# Patient Record
Sex: Female | Born: 1945 | Race: Black or African American | Hispanic: No | Marital: Single | Smoking: Never smoker
Health system: Southern US, Community
[De-identification: ages and names within clinical notes are randomized; demographics above are authoritative.]

## PROBLEM LIST (undated history)

## (undated) DIAGNOSIS — J189 Pneumonia, unspecified organism: Secondary | ICD-10-CM

## (undated) DIAGNOSIS — I10 Essential (primary) hypertension: Secondary | ICD-10-CM

## (undated) DIAGNOSIS — E7800 Pure hypercholesterolemia, unspecified: Secondary | ICD-10-CM

## (undated) DIAGNOSIS — K219 Gastro-esophageal reflux disease without esophagitis: Secondary | ICD-10-CM

## (undated) DIAGNOSIS — I499 Cardiac arrhythmia, unspecified: Secondary | ICD-10-CM

## (undated) DIAGNOSIS — M1990 Unspecified osteoarthritis, unspecified site: Secondary | ICD-10-CM

## (undated) DIAGNOSIS — D649 Anemia, unspecified: Secondary | ICD-10-CM

## (undated) HISTORY — PX: CYSTECTOMY: SHX5119

## (undated) HISTORY — PX: EYE SURGERY: SHX253

## (undated) HISTORY — PX: ARTHROPLASTY: SHX135

---

## 2011-03-29 ENCOUNTER — Emergency Department (HOSPITAL_COMMUNITY)
Admission: EM | Admit: 2011-03-29 | Discharge: 2011-03-29 | Disposition: A | Discharge status: Home / Self Care | Payer: Self-pay | Attending: Emergency Medicine | Admitting: Emergency Medicine

## 2011-03-29 ENCOUNTER — Encounter: Payer: Self-pay | Admitting: Nurse Practitioner

## 2011-03-29 ENCOUNTER — Other Ambulatory Visit: Payer: Self-pay

## 2011-03-29 ENCOUNTER — Emergency Department (HOSPITAL_COMMUNITY): Payer: Self-pay

## 2011-03-29 DIAGNOSIS — R079 Chest pain, unspecified: Secondary | ICD-10-CM | POA: Insufficient documentation

## 2011-03-29 DIAGNOSIS — E7800 Pure hypercholesterolemia, unspecified: Secondary | ICD-10-CM | POA: Insufficient documentation

## 2011-03-29 DIAGNOSIS — R110 Nausea: Secondary | ICD-10-CM | POA: Insufficient documentation

## 2011-03-29 DIAGNOSIS — K219 Gastro-esophageal reflux disease without esophagitis: Secondary | ICD-10-CM | POA: Insufficient documentation

## 2011-03-29 DIAGNOSIS — Z79899 Other long term (current) drug therapy: Secondary | ICD-10-CM | POA: Insufficient documentation

## 2011-03-29 DIAGNOSIS — R0602 Shortness of breath: Secondary | ICD-10-CM | POA: Insufficient documentation

## 2011-03-29 DIAGNOSIS — Z7982 Long term (current) use of aspirin: Secondary | ICD-10-CM | POA: Insufficient documentation

## 2011-03-29 DIAGNOSIS — I10 Essential (primary) hypertension: Secondary | ICD-10-CM | POA: Insufficient documentation

## 2011-03-29 DIAGNOSIS — M810 Age-related osteoporosis without current pathological fracture: Secondary | ICD-10-CM | POA: Insufficient documentation

## 2011-03-29 HISTORY — DX: Essential (primary) hypertension: I10

## 2011-03-29 HISTORY — DX: Pure hypercholesterolemia, unspecified: E78.00

## 2011-03-29 HISTORY — DX: Gastro-esophageal reflux disease without esophagitis: K21.9

## 2011-03-29 HISTORY — DX: Cardiac arrhythmia, unspecified: I49.9

## 2011-03-29 LAB — DIFFERENTIAL
Eosinophils Relative: 1 % (ref 0–5)
Lymphocytes Relative: 51 % — ABNORMAL HIGH (ref 12–46)
Monocytes Absolute: 0.3 10*3/uL (ref 0.1–1.0)
Monocytes Relative: 11 % (ref 3–12)
Neutro Abs: 1.1 10*3/uL — ABNORMAL LOW (ref 1.7–7.7)

## 2011-03-29 LAB — BASIC METABOLIC PANEL
BUN: 18 mg/dL (ref 6–23)
CO2: 30 mEq/L (ref 19–32)
Calcium: 9.7 mg/dL (ref 8.4–10.5)
Chloride: 105 mEq/L (ref 96–112)
Creatinine, Ser: 0.81 mg/dL (ref 0.50–1.10)
Glucose, Bld: 96 mg/dL (ref 70–99)

## 2011-03-29 LAB — CBC
HCT: 32.3 % — ABNORMAL LOW (ref 36.0–46.0)
Hemoglobin: 10.2 g/dL — ABNORMAL LOW (ref 12.0–15.0)
MCV: 96.1 fL (ref 78.0–100.0)
WBC: 3 10*3/uL — ABNORMAL LOW (ref 4.0–10.5)

## 2011-03-29 LAB — POCT I-STAT TROPONIN I

## 2011-03-29 MED ORDER — ALENDRONATE SODIUM 70 MG PO TABS: 70.0000 mg | ORAL_TABLET | ORAL | Status: DC

## 2011-03-29 MED ORDER — ASPIRIN 81 MG PO CHEW
324.0000 mg | CHEWABLE_TABLET | Freq: Once | ORAL | Status: AC
  Administered 2011-03-29: 324 mg via ORAL
  Filled 2011-03-29: qty 4

## 2011-03-29 MED ORDER — MORPHINE SULFATE 2 MG/ML IJ SOLN
2.0000 mg | Freq: Once | INTRAMUSCULAR | Status: AC
  Administered 2011-03-29: 2 mg via INTRAVENOUS
  Filled 2011-03-29: qty 1

## 2011-03-29 NOTE — ED Notes (Signed)
Lab at bedside

## 2011-03-29 NOTE — ED Notes (Signed)
Pt is calling her son to come and get her.

## 2011-03-29 NOTE — ED Notes (Signed)
Pt is ambulatory to the bathroom with one assist. 

## 2011-03-29 NOTE — ED Provider Notes (Signed)
Pt was a hand off patient from [facility], [place]. Pt here for chest pain for 3 years occuring more frequently.The patient was awaiting a second Tropinin and if negative a follow-up appointment needed to be scheduled with  Cardiology. I spoke with Dr. Nadara Eaton (private practice) who will call the patient in the morning to be seen within the next couple of days.   Dorthula Matas, PA 03/29/11 2022

## 2011-03-29 NOTE — ED Provider Notes (Signed)
History     CSN: [id number] Arrival date & time: 03/29/2011  1:28 PM   First MD Initiated Contact with Patient 03/29/11 1348      Chief Complaint  Patient presents with  . Chest Pain    (Consider location/radiation/quality/duration/timing/severity/associated sxs/prior treatment) HPI Comments: Patient who moved from [place] 2 months ago, with history of chest pain for 3 years - presents to the hospital today with episodes of chest pain that have been more frequent in nature. Patient states she had a episode of pressure in the middle of her chest that lasted approximately 20 minutes last night. This is associated with some shortness of breath and nausea. The pain did not radiate. She also admits to some short-lived episodes of pressure this morning. Patient has a remote history of a cardiac catheterization she states with minor blockages that did not require stenting. Patient has a history of high blood pressure and high cholesterol. She denies diabetes, smoking, significant family history.  Patient is a 65 y.o. female presenting with chest pain. The history is provided by the patient.  Chest Pain The chest pain began 3 - 5 hours ago. Chest pain occurs intermittently. The quality of the pain is described as pressure-like. The pain does not radiate. Primary symptoms include shortness of breath and nausea. Pertinent negatives for primary symptoms include no fever, no cough, no wheezing, no palpitations, no abdominal pain, no vomiting and no dizziness.  Pertinent negatives for associated symptoms include no diaphoresis. She tried nothing for the symptoms.  Her past medical history is significant for hyperlipidemia and hypertension.  Pertinent negatives for past medical history include no diabetes and no MI.  Pertinent negatives for family medical history include: no early MI in family.  Procedure history is positive for cardiac catheterization.     Past Medical History  Diagnosis Date  .  Acid reflux   . Hypertension   . Hypercholesteremia   . Osteoporosis   . Irregular heart beat     History reviewed. No pertinent past surgical history.  History reviewed. No pertinent family history.  History  Substance Use Topics  . Smoking status: Never Smoker   . Smokeless tobacco: Not on file  . Alcohol Use: No    OB History    Grav Para Term Preterm Abortions TAB SAB Ect Mult Living                  Review of Systems  Constitutional: Negative for fever and diaphoresis.  HENT: Negative for sore throat and rhinorrhea.   Eyes: Negative for redness.  Respiratory: Positive for shortness of breath. Negative for cough and wheezing.   Cardiovascular: Positive for chest pain. Negative for palpitations and leg swelling.  Gastrointestinal: Positive for nausea. Negative for vomiting and abdominal pain.  Genitourinary: Negative for dysuria.  Musculoskeletal: Negative for myalgias.  Skin: Negative for rash.  Neurological: Negative for dizziness and light-headedness.    Allergies  Penicillins  Home Medications   Current Outpatient Rx  Name Route Sig Dispense Refill  . ALENDRONATE SODIUM 70 MG PO TABS Oral Take 70 mg by mouth every 7 (seven) days. Take with a full glass of water on an empty stomach. Take on Thursday     . ASPIRIN EC 81 MG PO TBEC Oral Take 81 mg by mouth daily.      Marland Kitchen CALCIUM CARBONATE-VITAMIN D 500-200 MG-UNIT PO TABS Oral Take 1 tablet by mouth 2 (two) times daily.      Marland Kitchen LATANOPROST 0.005 %  OP SOLN Both Eyes Place 1 drop into both eyes at bedtime.      Marland Kitchen LOSARTAN POTASSIUM-HCTZ 100-25 MG PO TABS Oral Take 1 tablet by mouth daily.      Marland Kitchen METOPROLOL SUCCINATE ER 50 MG PO TB24 Oral Take 25 mg by mouth daily.      Marland Kitchen NIFEDIPINE ER 30 MG PO TB24 Oral Take 30 mg by mouth daily.      Marland Kitchen PANTOPRAZOLE SODIUM 40 MG PO TBEC Oral Take 40 mg by mouth daily.      Marland Kitchen SIMVASTATIN 40 MG PO TABS Oral Take 40 mg by mouth at bedtime.        BP 132/68  Pulse 56  Temp(Src)  98.6 F (37 C) (Oral)  Resp 16  Ht 5\' 4"  (1.626 m)  Wt 135 lb (61.236 kg)  BMI 23.17 kg/m2  SpO2 100%  Physical Exam  Nursing note and vitals reviewed. Constitutional: She is oriented to person, place, and time. She appears well-developed and well-nourished.  HENT:  Head: Normocephalic and atraumatic.  Eyes: Pupils are equal, round, and reactive to light. Right eye exhibits no discharge. Left eye exhibits no discharge.  Neck: Normal range of motion. Neck supple.  Cardiovascular: Normal rate and regular rhythm.  Exam reveals no gallop and no friction rub.   No murmur heard. Pulmonary/Chest: Effort normal and breath sounds normal. No respiratory distress. She has no wheezes. She exhibits no tenderness.  Abdominal: Soft. There is no tenderness. There is no rebound and no guarding.  Musculoskeletal: Normal range of motion.  Neurological: She is alert and oriented to person, place, and time.  Skin: Skin is warm and dry. No rash noted.  Psychiatric: She has a normal mood and affect.    ED Course  Procedures (including critical care time)   Labs Reviewed  CBC  DIFFERENTIAL  BASIC METABOLIC PANEL  I-STAT TROPONIN I   No results found.   1. Chest pain     1:57 PM Patient seen and examined. Workup for chest pain initiated. EKG reviewed.   Date: 03/29/2011  Rate: 51  Rhythm: sinus bradycardia  QRS Axis: normal  Intervals: normal  ST/T Wave abnormalities: normal  Conduction Disutrbances:none  Narrative Interpretation:   Old EKG Reviewed: none available  3:32 PM x-ray reviewed by myself. Handoff to [employer] Plan: complete work-up including 2 cardiac markers and call cards to arrange follow-up.   MDM  Chest pain x 3 years, increased frequency recently.         Eustace Moore [place], [place] 03/29/11 1535

## 2011-03-29 NOTE — ED Provider Notes (Signed)
Medical screening examination/treatment/procedure(s) were conducted as a shared visit with non-physician practitioner(s) and myself.  I personally evaluated the patient during the encounter.  Pt alert and comfortable. She has ongoing CP, intermittantly for years. Evaluation in ED is negative. Pt has no chest pain now.   Flint Melter, MD 03/29/11 2022

## 2011-03-29 NOTE — ED Provider Notes (Signed)
Medical screening examination/treatment/procedure(s) were conducted as a shared visit with non-physician practitioner(s) and myself.  I personally evaluated the patient during the encounter  Flint Melter, MD 03/29/11 [phone]

## 2011-03-29 NOTE — ED Notes (Signed)
C/o intermittent cp, sob, nausea since last night. Pt states she is currently being treated by a cardiologist for chest pain and they have not been able to make a diagnosis yet. Pt states pain in LUQ/L breast

## 2011-03-29 NOTE — ED Provider Notes (Signed)
Medical screening examination/treatment/procedure(s) were performed by non-physician practitioner and as supervising physician I was immediately available for consultation/collaboration.  Heather Mckendree R. Lucciana Head, MD 03/29/11 1551 

## 2011-04-18 ENCOUNTER — Encounter: Payer: Self-pay | Admitting: Family Medicine

## 2011-04-18 ENCOUNTER — Ambulatory Visit (INDEPENDENT_AMBULATORY_CARE_PROVIDER_SITE_OTHER): Payer: Self-pay | Admitting: Family Medicine

## 2011-04-18 DIAGNOSIS — I493 Ventricular premature depolarization: Secondary | ICD-10-CM | POA: Insufficient documentation

## 2011-04-18 DIAGNOSIS — K219 Gastro-esophageal reflux disease without esophagitis: Secondary | ICD-10-CM | POA: Insufficient documentation

## 2011-04-18 DIAGNOSIS — E785 Hyperlipidemia, unspecified: Secondary | ICD-10-CM | POA: Insufficient documentation

## 2011-04-18 DIAGNOSIS — I4949 Other premature depolarization: Secondary | ICD-10-CM

## 2011-04-18 DIAGNOSIS — M810 Age-related osteoporosis without current pathological fracture: Secondary | ICD-10-CM | POA: Insufficient documentation

## 2011-04-18 DIAGNOSIS — I10 Essential (primary) hypertension: Secondary | ICD-10-CM

## 2011-04-18 LAB — COMPREHENSIVE METABOLIC PANEL
ALT: 10 U/L (ref 0–35)
Albumin: 4.1 g/dL (ref 3.5–5.2)
CO2: 28 mEq/L (ref 19–32)
Calcium: 9.4 mg/dL (ref 8.4–10.5)
Chloride: 105 mEq/L (ref 96–112)
Potassium: 3.5 mEq/L (ref 3.5–5.3)
Sodium: 143 mEq/L (ref 135–145)
Total Bilirubin: 0.3 mg/dL (ref 0.3–1.2)
Total Protein: 6.6 g/dL (ref 6.0–8.3)

## 2011-04-18 LAB — CBC
MCV: 98.3 fL (ref 78.0–100.0)
Platelets: 214 10*3/uL (ref 150–400)
RDW: 13.6 % (ref 11.5–15.5)
WBC: 3 10*3/uL — ABNORMAL LOW (ref 4.0–10.5)

## 2011-04-18 LAB — LDL CHOLESTEROL, DIRECT: Direct LDL: 69 mg/dL

## 2011-04-18 MED ORDER — ALENDRONATE SODIUM 70 MG PO TABS: 70.0000 mg | ORAL_TABLET | ORAL | Status: DC

## 2011-04-18 MED ORDER — METOPROLOL SUCCINATE ER 50 MG PO TB24: 25.0000 mg | ORAL_TABLET | Freq: Every day | ORAL | Status: DC

## 2011-04-18 NOTE — Progress Notes (Signed)
  Subjective:    Patient ID: Kristy Cox, female    DOB: 08-Jun-1945, 66 y.o.   MRN: [id number]  HPI 1. Hypertension/establish care: Patient moved to the area approximately 3 months ago from [place]. Would like to be established with a physician here. Has history of hypertension currently on nifedipine, losartan/HCTZ, metoprolol. Blood pressure is well controlled on this regimen. She does check her blood pressure at home and typically runs in the 115-120 systolic. She does get occasional palpitations however she denies chest pain, headache, nausea, weakness, vision changes. She needs a refill on her metoprolol.  2. Osteoporosis: She had a bone density test last year however she is unsure of her T score. Currently on alendronate over the past year, along with calcium and vitamin D. No history of fracture.     Review of Systems Per history of present illness    Objective:   Physical Exam Generally: Very pleasant female in no acute distress Neck: Supple without adenopathy or thyromegaly. Cardio: Regular rate and rhythm, no murmur, no rub, no gallop. No extra beats heard. Pulmonary: Lungs are clear to auscultation bilaterally without wheezes Extremities: Warm well perfused without edema.       Assessment & Plan:

## 2011-04-18 NOTE — Patient Instructions (Signed)
It was nice to meet you today. It appears that your blood pressure is under good control. Please look into getting the orange card to assist you in your visits within the [facility] system I will let you know the results of your labs when they return If you have questions please let me know.  I would like to see you back in 6 months or sooner as needed.

## 2011-04-18 NOTE — Assessment & Plan Note (Addendum)
BP appears well controlled.  Continue current medications, toprol xl refilled.  Will check CM[facility] today for baseline since on Losartan.

## 2011-04-18 NOTE — Assessment & Plan Note (Addendum)
Currently on simvastatin, continue and check D-LDL today.  Check LFT's from CM[facility]

## 2011-09-09 ENCOUNTER — Observation Stay (HOSPITAL_COMMUNITY)
Admission: EM | Admit: 2011-09-09 | Discharge: 2011-09-11 | Disposition: A | Discharge status: Home / Self Care | Payer: Medicare Other | Attending: Family Medicine | Admitting: Family Medicine

## 2011-09-09 ENCOUNTER — Encounter (HOSPITAL_COMMUNITY): Payer: Self-pay | Admitting: Emergency Medicine

## 2011-09-09 DIAGNOSIS — I10 Essential (primary) hypertension: Secondary | ICD-10-CM | POA: Insufficient documentation

## 2011-09-09 DIAGNOSIS — M810 Age-related osteoporosis without current pathological fracture: Secondary | ICD-10-CM | POA: Insufficient documentation

## 2011-09-09 DIAGNOSIS — I951 Orthostatic hypotension: Secondary | ICD-10-CM | POA: Insufficient documentation

## 2011-09-09 DIAGNOSIS — R55 Syncope and collapse: Secondary | ICD-10-CM

## 2011-09-09 DIAGNOSIS — E876 Hypokalemia: Secondary | ICD-10-CM | POA: Insufficient documentation

## 2011-09-09 DIAGNOSIS — E785 Hyperlipidemia, unspecified: Secondary | ICD-10-CM | POA: Diagnosis present

## 2011-09-09 DIAGNOSIS — I4949 Other premature depolarization: Secondary | ICD-10-CM | POA: Insufficient documentation

## 2011-09-09 DIAGNOSIS — R42 Dizziness and giddiness: Principal | ICD-10-CM | POA: Insufficient documentation

## 2011-09-09 DIAGNOSIS — E7800 Pure hypercholesterolemia, unspecified: Secondary | ICD-10-CM | POA: Insufficient documentation

## 2011-09-09 LAB — CBC
Hemoglobin: 11.8 g/dL — ABNORMAL LOW (ref 12.0–15.0)
MCHC: 32.9 g/dL (ref 30.0–36.0)
RDW: 13.4 % (ref 11.5–15.5)

## 2011-09-09 LAB — BASIC METABOLIC PANEL
BUN: 12 mg/dL (ref 6–23)
GFR calc Af Amer: 83 mL/min — ABNORMAL LOW (ref >90)
GFR calc non Af Amer: 71 mL/min — ABNORMAL LOW (ref >90)
Potassium: 3.2 mEq/L — ABNORMAL LOW (ref 3.5–5.1)

## 2011-09-09 LAB — POCT I-STAT TROPONIN I: Troponin i, poc: 0 ng/mL (ref 0.00–0.08)

## 2011-09-09 MED ORDER — SODIUM CHLORIDE 0.9 % IV BOLUS (SEPSIS)
750.0000 mL | Freq: Once | INTRAVENOUS | Status: AC
  Administered 2011-09-09: 750 mL via INTRAVENOUS

## 2011-09-09 MED ORDER — POTASSIUM CHLORIDE CRYS ER 20 MEQ PO TBCR
40.0000 meq | EXTENDED_RELEASE_TABLET | Freq: Once | ORAL | Status: AC
  Administered 2011-09-09: 40 meq via ORAL
  Filled 2011-09-09: qty 2

## 2011-09-09 MED ORDER — ONDANSETRON 4 MG PO TBDP
4.0000 mg | ORAL_TABLET | Freq: Once | ORAL | Status: AC
  Administered 2011-09-09: 4 mg via ORAL
  Filled 2011-09-09: qty 1

## 2011-09-09 NOTE — ED Notes (Signed)
Updated on being admitted to the hosp and family  At bedside.

## 2011-09-09 NOTE — H&P (Signed)
Kristy Cox is an 66 y.o. female.   Chief Complaint: Dizziness HPI: [name] with PMHx significant for HTN, HLD and PVCs presents with dizziness.  Dizziness first started ~ 2 weeks ago while at the store.  Symptoms feels as feel the room is spinning, not like she is getting ready to pass out.  This can happen when she is sitting but is more common when standing.  It does not happen when she is lying down.  It may happen during positional changes, such as sitting to standing.  Has been having 3-4 episodes per day, which have not changed over the past 2 weeks.  Orthostatic vital signs in the ED were positive.  Has associated nausea with these episodes and some headache/pounding in her head. Denies chest pain, SOB, weakness, or confusion with these episodes.  Denies palpitations.  No vision or hearing changes.  Feels like it is similar to vertigo, which she has had in the past.  Has not had any recent viral illnesses.  Does complain of some runny nose, which she attributes to allergies.   While in the ED, pt has positive orthostatic vital signs and became very white at pale upon standing at one point.  She also had750cc NS, of K-dur and Zofran 4mg  x1. Does not appear dehydrated. Hgb 11.8 (10.9 5 months ago), EKG showing sinus bradycardia (HR 51).  Past Medical History  Diagnosis Date  . Acid reflux   . Hypertension   . Hypercholesteremia   . Osteoporosis   . Irregular heart beat     PVC's    History reviewed. No pertinent past surgical history.  Family History  Problem Relation Age of Onset  . Breast cancer Mother   . Lung cancer Sister   . Coronary artery disease     Social History:  reports that she has never smoked. She does not have any smokeless tobacco history on file. She reports that she does not drink alcohol or use illicit drugs.  Allergies:  Allergies  Allergen Reactions  . Penicillins Other (See Comments)    unknown     (Not in a hospital admission)  Results for  orders placed during the hospital encounter of 09/09/11 (from the past 48 hour(s))  BASIC METABOLIC PANEL     Status: Abnormal   Collection Time   09/09/11  6:03 PM      Component Value Range Comment   Sodium 142  135 - 145 (mEq/L)    Potassium 3.2 (*) 3.5 - 5.1 (mEq/L)    Chloride 102  96 - 112 (mEq/L)    CO2 28  19 - 32 (mEq/L)    Glucose, Bld 95  70 - 99 (mg/dL)    BUN 12  6 - 23 (mg/dL)    Creatinine, Ser 4.09  0.50 - 1.10 (mg/dL)    Calcium 81.1 (*) 8.4 - 10.5 (mg/dL)    GFR calc non Af Amer 71 (*) >90 (mL/min)    GFR calc Af Amer 83 (*) >90 (mL/min)   CBC     Status: Abnormal   Collection Time   09/09/11  6:03 PM      Component Value Range Comment   WBC 3.6 (*) 4.0 - 10.5 (K/uL)    RBC 3.90  3.87 - 5.11 (MIL/uL)    Hemoglobin 11.8 (*) 12.0 - 15.0 (g/dL)    HCT 91.4 (*) 78.2 - 46.0 (%)    MCV 92.1  78.0 - 100.0 (fL)    MCH 30.3  26.0 - 34.0 (pg)    MCHC 32.9  30.0 - 36.0 (g/dL)    RDW 09.8  11.9 - 14.7 (%)    Platelets 187  150 - 400 (K/uL)    No results found.  Review of Systems  Constitutional: Negative for fever, chills and malaise/fatigue.  HENT: Positive for congestion. Negative for hearing loss, ear pain, sore throat, tinnitus and ear discharge.   Eyes: Negative for blurred vision and double vision.  Respiratory: Negative for cough and shortness of breath.   Cardiovascular: Negative for chest pain, palpitations, claudication and leg swelling.  Gastrointestinal: Positive for nausea. Negative for heartburn, vomiting, abdominal pain, diarrhea, constipation, blood in stool and melena.  Genitourinary: Negative for dysuria.  Musculoskeletal: Negative for myalgias and falls.  Skin: Negative for rash.  Neurological: Positive for dizziness and headaches. Negative for tremors, sensory change, speech change, focal weakness, seizures, loss of consciousness and weakness.    Blood pressure 152/87, pulse 62, temperature 98.4 F (36.9 C), temperature source Oral, resp. rate 18,  SpO2 98.00%. Physical Exam  Constitutional: She is oriented to person, place, and time. She appears well-developed and well-nourished. No distress.  HENT:  Head: Normocephalic and atraumatic.  Right Ear: External ear normal.  Left Ear: External ear normal.  Nose: Nose normal.  Mouth/Throat: Oropharynx is clear and moist. No oropharyngeal exudate.  Eyes: Conjunctivae are normal. Pupils are equal, round, and reactive to light.  Neck: Normal range of motion. Neck supple. No thyromegaly present.  Cardiovascular: Normal rate, regular rhythm, normal heart sounds and intact distal pulses.   No murmur heard. Respiratory: Effort normal and breath sounds normal. No respiratory distress. She has no wheezes.  GI: Soft. Bowel sounds are normal. She exhibits no distension. There is no tenderness.  Musculoskeletal: Normal range of motion. She exhibits no edema.  Lymphadenopathy:    She has no cervical adenopathy.  Neurological: She is oriented to person, place, and time. No cranial nerve deficit.  Skin: Skin is warm and dry. No rash noted.     Assessment/Plan  [name] with PMHx significant for HTN, HLD and PVCs presents with dizziness  1. Dizziness: vertigo/labrinthytis vs vasovagal vs orthostatic hypotension vs other -Positive orthostatic vital signs in the ED without apparent reason for dehydration/volume depletion (no recent illnesses or decreased po)- lying: 131/73, 56 --> sitting: 133/78, 62 --> standing 111/80, 66 -Admit to tele to monitor for dysrhythmias  -Check TSH, AM EKG -Optimize electrolytes, check Mag and Phos in AM with BMET -Consider meclizine in AM  2. Hypokalemia: 3.2 -Received po K-dur in ED -Repeat BMET in AM  3. HTN, h/o PVCs: lying BPs slightly elevated, but standing BPs normal; no PVCs on EKG -Home regimen: hyzaar 100/25, procardia 30; was on metoprolol but has not taken in >1 month  4. HLD: -home regimen zocor 40 -continue while in house  FEN/GI: heart  healthy diet ad lib, NS @ 100cc/hr Ppx: SQ heparin, protonix (home med) Dispo: obs in tele overnight to r/o dysrhythmia as cause of dizziness   Amandalee Lacap 09/09/2011, 9:13 PM

## 2011-09-09 NOTE — ED Notes (Signed)
Attempted to call report, unit sec stated pt nurse is on lunch

## 2011-09-09 NOTE — ED Provider Notes (Signed)
  I performed a history and physical examination of Kristy Cox and discussed her management with Dr. Ashley Royalty.  I agree with the history, physical, assessment, and plan of care, with the following exceptions: None  This elderly F p/w dizziness, and in spite of fluids, she remained acutely symptomatic.  She had essentially unremarkable ED eval and was admitted for further e/m.  Elyse Jarvis, MD 09/09/11 2226

## 2011-09-09 NOTE — ED Notes (Signed)
Pt complains of nausea for several weeks now and dizziness.

## 2011-09-09 NOTE — ED Notes (Addendum)
Pt almost pass out getting OOB to chair, pt became very pale and diaphoretic, lips white and pt stated I am blacking out. Assisted pt back to bed. MD notified of  The above incident.

## 2011-09-09 NOTE — ED Notes (Signed)
Pt states dizziness in the setting to standing position

## 2011-09-09 NOTE — ED Provider Notes (Signed)
History     CSN: [id number]  Arrival date & time 09/09/11  1553   First MD Initiated Contact with Patient 09/09/11 1742      Chief Complaint  Patient presents with  . Dizziness    (Consider location/radiation/quality/duration/timing/severity/associated sxs/prior treatment) HPI 66 yo female with history of HTN and PVC's who presents with complaint of dizziness.  States that dizziness began ~2 weeks ago when she was at the grocery store.  Describes symptoms as "feeling like the room is spinning".   States "episodes" can come on while sitting down but worse with positional movements, such as standing or lying down.  She does have associated nausea.  She denies any feeling that she is going to pass out.  Hx of PVC's but denies palpitations.  No recent illness or seasonal allergies.  Has had vertigo in the past and this feels similar to that episode.  No chest pain, weakness, shortness of breath. Past Medical History  Diagnosis Date  . Acid reflux   . Hypertension   . Hypercholesteremia   . Osteoporosis   . Irregular heart beat     PVC's    History reviewed. No pertinent past surgical history.  Family History  Problem Relation Age of Onset  . Breast cancer Mother   . Lung cancer Sister   . Coronary artery disease      History  Substance Use Topics  . Smoking status: Never Smoker   . Smokeless tobacco: Not on file  . Alcohol Use: No    OB History    Grav Para Term Preterm Abortions TAB SAB Ect Mult Living                  Review of Systems  Allergies  Penicillins  Home Medications   Current Outpatient Rx  Name Route Sig Dispense Refill  . ALENDRONATE SODIUM 70 MG PO TABS Oral Take 70 mg by mouth every 7 (seven) days. Take with a full glass of water on an empty stomach. Take on Thursday    . ASPIRIN EC 81 MG PO TBEC Oral Take 81 mg by mouth daily.      Marland Kitchen CALCIUM CARBONATE-VITAMIN D 500-200 MG-UNIT PO TABS Oral Take 1 tablet by mouth 2 (two) times daily.      Marland Kitchen  LATANOPROST 0.005 % OP SOLN Both Eyes Place 1 drop into both eyes at bedtime.      Marland Kitchen LOSARTAN POTASSIUM-HCTZ 100-25 MG PO TABS Oral Take 1 tablet by mouth daily.      Marland Kitchen NIFEDIPINE ER 30 MG PO TB24 Oral Take 30 mg by mouth daily.      Marland Kitchen PANTOPRAZOLE SODIUM 40 MG PO TBEC Oral Take 40 mg by mouth daily.      Marland Kitchen SIMVASTATIN 40 MG PO TABS Oral Take 40 mg by mouth at bedtime.        BP 147/82  Pulse 57  Temp(Src) 97.7 F (36.5 C) (Oral)  Resp 20  SpO2 99%  Physical Exam  Constitutional: She appears well-nourished. No distress.  HENT:  Head: Normocephalic and atraumatic.  Right Ear: Tympanic membrane, external ear and ear canal normal.  Left Ear: Tympanic membrane, external ear and ear canal normal.  Nose: No mucosal edema or rhinorrhea. Right sinus exhibits no maxillary sinus tenderness and no frontal sinus tenderness. Left sinus exhibits no maxillary sinus tenderness and no frontal sinus tenderness.  Mouth/Throat: Oropharynx is clear and moist.    ED Course  Procedures (including critical care time)  Labs Reviewed  BASIC METABOLIC PANEL - Abnormal; Notable for the following:    Potassium 3.2 (*)    Calcium 10.7 (*)    GFR calc non Af Amer 71 (*)    GFR calc Af Amer 83 (*)    All other components within normal limits  CBC - Abnormal; Notable for the following:    WBC 3.6 (*)    Hemoglobin 11.8 (*)    HCT 35.9 (*)    All other components within normal limits   No results found.   No diagnosis found.    MDM  Symptoms sound more like vertiginous symptoms, however Orthostatics with >60mmHg , will give fluid bolus and see if sx improves.  No neurological deficits or nystagmus on exam.    8:13 PM   After fluid bolus, no improvement of symptoms.  Still with severe dizziness and turned pale with light headedness with standing after fluid bolus.  FP called for admission. 9:13 PM           Everrett Coombe, DO 09/09/11 2114

## 2011-09-09 NOTE — ED Notes (Signed)
Pt and family updated about the room #

## 2011-09-09 NOTE — ED Notes (Signed)
C/o intermittent dizziness and nausea x 2 weeks.  Denies pain.

## 2011-09-10 ENCOUNTER — Encounter (HOSPITAL_COMMUNITY): Payer: Self-pay | Admitting: *Deleted

## 2011-09-10 ENCOUNTER — Observation Stay (HOSPITAL_COMMUNITY): Payer: Medicare Other

## 2011-09-10 DIAGNOSIS — R42 Dizziness and giddiness: Secondary | ICD-10-CM

## 2011-09-10 LAB — CREATININE, SERUM
Creatinine, Ser: 0.84 mg/dL (ref 0.50–1.10)
GFR calc non Af Amer: 71 mL/min — ABNORMAL LOW (ref >90)

## 2011-09-10 LAB — CBC
HCT: 34.4 % — ABNORMAL LOW (ref 36.0–46.0)
Hemoglobin: 11.3 g/dL — ABNORMAL LOW (ref 12.0–15.0)
MCH: 30.3 pg (ref 26.0–34.0)
MCHC: 32.8 g/dL (ref 30.0–36.0)
RBC: 3.73 MIL/uL — ABNORMAL LOW (ref 3.87–5.11)

## 2011-09-10 LAB — BASIC METABOLIC PANEL
Chloride: 106 mEq/L (ref 96–112)
GFR calc Af Amer: 82 mL/min — ABNORMAL LOW (ref >90)
GFR calc non Af Amer: 70 mL/min — ABNORMAL LOW (ref >90)
Glucose, Bld: 86 mg/dL (ref 70–99)
Potassium: 3.5 mEq/L (ref 3.5–5.1)
Sodium: 142 mEq/L (ref 135–145)

## 2011-09-10 LAB — TSH: TSH: 1.541 u[IU]/mL (ref 0.350–4.500)

## 2011-09-10 LAB — PHOSPHORUS: Phosphorus: 3.5 mg/dL (ref 2.3–4.6)

## 2011-09-10 MED ORDER — LOSARTAN POTASSIUM 50 MG PO TABS
50.0000 mg | ORAL_TABLET | Freq: Every day | ORAL | Status: DC
  Filled 2011-09-10: qty 1

## 2011-09-10 MED ORDER — LATANOPROST 0.005 % OP SOLN
1.0000 [drp] | Freq: Every day | OPHTHALMIC | Status: DC
  Administered 2011-09-10: 1 [drp] via OPHTHALMIC
  Filled 2011-09-10: qty 2.5

## 2011-09-10 MED ORDER — SIMVASTATIN 40 MG PO TABS
40.0000 mg | ORAL_TABLET | Freq: Every day | ORAL | Status: DC
  Administered 2011-09-10: 40 mg via ORAL
  Filled 2011-09-10 (×2): qty 1

## 2011-09-10 MED ORDER — NIFEDIPINE ER 30 MG PO TB24
30.0000 mg | ORAL_TABLET | Freq: Every day | ORAL | Status: DC
  Filled 2011-09-10: qty 1

## 2011-09-10 MED ORDER — SODIUM CHLORIDE 0.9 % IV SOLN
INTRAVENOUS | Status: DC
  Administered 2011-09-10: 01:00:00 via INTRAVENOUS

## 2011-09-10 MED ORDER — MECLIZINE HCL 12.5 MG PO TABS
12.5000 mg | ORAL_TABLET | Freq: Two times a day (BID) | ORAL | Status: DC
  Administered 2011-09-10 – 2011-09-11 (×2): 12.5 mg via ORAL
  Filled 2011-09-10 (×4): qty 1

## 2011-09-10 MED ORDER — SODIUM CHLORIDE 0.9 % IJ SOLN
3.0000 mL | Freq: Two times a day (BID) | INTRAMUSCULAR | Status: DC
  Administered 2011-09-10 (×3): 3 mL via INTRAVENOUS

## 2011-09-10 MED ORDER — ASPIRIN EC 81 MG PO TBEC
81.0000 mg | DELAYED_RELEASE_TABLET | Freq: Every day | ORAL | Status: DC
  Administered 2011-09-10 – 2011-09-11 (×2): 81 mg via ORAL
  Filled 2011-09-10 (×2): qty 1

## 2011-09-10 MED ORDER — PANTOPRAZOLE SODIUM 40 MG PO TBEC
40.0000 mg | DELAYED_RELEASE_TABLET | Freq: Every day | ORAL | Status: DC
  Administered 2011-09-10 – 2011-09-11 (×2): 40 mg via ORAL
  Filled 2011-09-10 (×2): qty 1

## 2011-09-10 MED ORDER — MECLIZINE HCL 12.5 MG PO TABS
12.5000 mg | ORAL_TABLET | Freq: Two times a day (BID) | ORAL | Status: DC
  Filled 2011-09-10: qty 1

## 2011-09-10 MED ORDER — LOSARTAN POTASSIUM-HCTZ 100-25 MG PO TABS
1.0000 | ORAL_TABLET | Freq: Every day | ORAL | Status: DC
Start: 2011-09-10 — End: 2011-09-10

## 2011-09-10 MED ORDER — LOSARTAN POTASSIUM 50 MG PO TABS
100.0000 mg | ORAL_TABLET | Freq: Every day | ORAL | Status: DC
  Administered 2011-09-10: 100 mg via ORAL
  Filled 2011-09-10: qty 2

## 2011-09-10 MED ORDER — ACETAMINOPHEN 325 MG PO TABS: 650.0000 mg | ORAL_TABLET | Freq: Four times a day (QID) | ORAL | Status: DC | PRN

## 2011-09-10 MED ORDER — HYDROCHLOROTHIAZIDE 25 MG PO TABS
25.0000 mg | ORAL_TABLET | Freq: Every day | ORAL | Status: DC
  Administered 2011-09-10: 25 mg via ORAL
  Filled 2011-09-10: qty 1

## 2011-09-10 MED ORDER — HEPARIN SODIUM (PORCINE) 5000 UNIT/ML IJ SOLN
5000.0000 [IU] | Freq: Three times a day (TID) | INTRAMUSCULAR | Status: DC
  Administered 2011-09-10 – 2011-09-11 (×4): 5000 [IU] via SUBCUTANEOUS
  Filled 2011-09-10 (×7): qty 1

## 2011-09-10 MED ORDER — ACETAMINOPHEN 650 MG RE SUPP: 650.0000 mg | Freq: Four times a day (QID) | RECTAL | Status: DC | PRN

## 2011-09-10 MED ORDER — HYDROCHLOROTHIAZIDE 25 MG PO TABS
25.0000 mg | ORAL_TABLET | Freq: Every day | ORAL | Status: DC
  Filled 2011-09-10: qty 1

## 2011-09-10 NOTE — H&P (Signed)
Family Medicine Teaching Service Attending Note  I interviewed and examined patient Kristy Cox and reviewed their tests and x-rays.  I discussed with Dr. Fara Boros and reviewed their note for today.  I agree with their assessment and plan.     Additionally  This AM feels well until stands then lightheadness. No vertigo. Needs to hold on to walk. Neuro exam is normal except for gait Most likely cause of symptoms is a combination of vestibular dysfunction causing her episodic vertigo complaints and othostatic hypotension causing her ambulation difficulties.  However need to rule out cerebellar CVA given her age and risk factors - Check MRI.  Reduce blood pressure medications

## 2011-09-10 NOTE — Progress Notes (Signed)
Reviewed MRI results with patient and son.  Patient tolerated one dose of Meclizine.  BP remained low 90/60 after taking medication, but patient denied any dizziness.  Nifedipine held.  PT/OT recommended outpatient follow up.  Will D/C home tomorrow in AM after getting Meclizine in AM.  Will set up outpatient PT/OT and follow up with PCP prior to discharge.  Patient and son agree with plan.

## 2011-09-10 NOTE — Progress Notes (Signed)
Family Medicine Teaching Service Attending Note  I interviewed and examined patient Kristy Cox and reviewed their tests and x-rays.  I discussed with Dr. Fara Boros and reviewed their note for today.  I agree with their assessment and plan.     Additionally  See admit note

## 2011-09-10 NOTE — Discharge Summary (Signed)
Physician Discharge Summary  Patient ID: Kristy Cox [id number] 07-21-1945 66 y.o.  Admit date: 09/09/2011 Discharge date: 09/11/2011  PCP: Dr. Ashley Royalty   Discharge Diagnosis: 1. Dizziness/Vertigo 2. Orthostatic hypotension   Discharge Medications  [place], [place]  Home Medication Instructions FAO:[id number]   Printed on:09/11/11 0900  Medication Information                    calcium-vitamin D (OSCAL WITH D) 500-200 MG-UNIT per tablet Take 1 tablet by mouth 2 (two) times daily.             pantoprazole (PROTONIX) 40 MG tablet Take 40 mg by mouth daily.             simvastatin (ZOCOR) 40 MG tablet Take 40 mg by mouth at bedtime.             latanoprost (XALATAN) 0.005 % ophthalmic solution Place 1 drop into both eyes at bedtime.             aspirin EC 81 MG tablet Take 81 mg by mouth daily.             alendronate (FOSAMAX) 70 MG tablet Take 70 mg by mouth every 7 (seven) days. Take with a full glass of water on an empty stomach. Take on Thursday           meclizine (ANTIVERT) 12.5 MG tablet Take 1 tablet (12.5 mg total) by mouth 2 (two) times daily.           losartan-hydrochlorothiazide (HYZAAR) 100-25 MG per tablet Take 0.5 tablets by mouth daily.               Consults: PT/OT - recommend outpatient PT and OT  Labs: CBC  Lab 09/10/11 0156 09/09/11 1803  WBC 3.2* 3.6*  HGB 11.3* 11.8*  HCT 34.4* 35.9*  PLT 178 187   BMET  Lab 09/10/11 0632 09/10/11 0156 09/09/11 1803  NA 142 -- 142  K 3.5 -- 3.2*  CL 106 -- 102  CO2 28 -- 28  BUN 11 -- 12  CREATININE 0.85 0.84 0.84  CALCIUM 9.5 -- 10.7*  PROT -- -- --  BILITOT -- -- --  ALKPHOS -- -- --  ALT -- -- --  AST -- -- --  GLUCOSE 86 -- 95       Component Value   Troponin i, poc 0.00    Comment 3              Component Value   TSH 1.541       Component Value      Component Value   Magnesium 1.8       Component Value   Phosphorus 3.5     Procedures/Imaging:  Mr Shirlee Latch ON  Contrast  09/10/2011  *RADIOLOGY REPORT*  Clinical Data:  Dizziness.  Assess for stroke.  MRI HEAD WITHOUT CONTRAST MRA HEAD WITHOUT CONTRAST IMPRESSION: No acute or reversible findings.  Minimal chronic small vessel changes within the pons and hemispheric white matter.  MRA HEAD  Findings: Both internal carotid arteries are widely patent into the brain.  The anterior and middle cerebral vessels are normal without proximal stenosis, aneurysm or vascular malformation.  The left vertebral artery terminates in pica, with only a tiny contribution to the basilar.  Right vertebral artery is widely patent to the basilar.  No basilar stenosis.  Posterior circulation branch vessels appear normal.  IMPRESSION: Normal intracranial MR angiography of the large and medium-sized vessels.  Original Report Authenticated By: Thomasenia Sales, M.D.   Mr Brain Wo Contrast  09/10/2011  *RADIOLOGY REPORT*  Clinical Data:  Dizziness.  Assess for stroke.  MRI HEAD WITHOUT CONTRAST MRA HEAD WITHOUT CONTRAST IMPRESSION: No acute or reversible findings.  Minimal chronic small vessel changes within the pons and hemispheric white matter.  MRA HEAD  Findings: Both internal carotid arteries are widely patent into the brain.  The anterior and middle cerebral vessels are normal without proximal stenosis, aneurysm or vascular malformation.  The left vertebral artery terminates in pica, with only a tiny contribution to the basilar.  Right vertebral artery is widely patent to the basilar.  No basilar stenosis.  Posterior circulation branch vessels appear normal.  IMPRESSION: Normal intracranial MR angiography of the large and medium-sized vessels.  Original Report Authenticated By: Thomasenia Sales, M.D.     Brief Hospital Course: [name] with PMHx significant for HTN, HLD and PVCs presents with dizziness and unsteady gait.  1. Dizziness: Likely secondary to both vestibular dysfunction and orthostatic hypotension. Positive orthostatic vital  signs in the ED without apparent reason for dehydration/volume depletion (lying: 131/73, 56 --> sitting: 133/78, 62 --> standing 111/80, 66).   No events on telemetry.   TSH within normal limits.  MRI was negative for acute stroke.   Started Meclizine and patient was able to ambulate without any complaints of dizziness.  BP did drop to 94/67.  Held Nifedipine and decreased Cozaar to 50 mg.   PT/OT recommended outpatient follow up.  2. Hypokalemia: 3.2 initially and received po K-dur in ED.  Repeat K 3.5.  Follow up with PCP.  3. HTN, h/o PVCs: lying BPs slightly elevated, but standing BPs normal.  Patient's BP ranged from 94/67-132/68.  We discontinued Nifedipine.  Started Meclizine for vertigo which caused BP to drop to 94/67.  I decreased Cozaar to 50 mg daily and continued HCTZ 25 mg daily.   Patient to recheck BP at outpatient follow up appointment.  4. HLD: Continued home dose Zocor 40.   Patient condition at time of discharge/disposition:  Patient is medically stable for discharge.   Follow up issues: 1. Outpatient PT/OT for vestibular dysfunction 2. Vertigo, now on Meclizine 3. Hypokalemia, repeat BMET at next visit 4. Hypertension, now that Nifedipine discontinued and Losartan-HCTZ decreased  Discharge follow up:  Discharge Orders    Future Appointments: Provider: Department: Dept Phone: Center:   09/18/2011 1:30 PM Everrett Coombe, DO [facility] [phone] [facility]      Yousef Huge [place], DO [facility]

## 2011-09-10 NOTE — Evaluation (Signed)
Physical Therapy Evaluation Patient Details Name: Kristy Cox MRN: [id number] DOB: 1946/04/06 Today's Date: 09/10/2011 Time: 6578-4696 PT Time Calculation (min): 24 min  PT Assessment / Plan / Recommendation Clinical Impression  Pt adm with 2 week h/o dizziness with some episodes of vertigo (spinning). Unable to elicit any vertigo (only lightheadedness) with general mobility and no nystagmus noted. + sense of lightheadedness when rolling from Rt sidelying to supine and  with supine to sit. Pt with BP decr from 131/67 to 120/72 as progressed supine to standing. Pt with normal VOR however with incr lightheaded feeling (denied vertigo). Full vestibular evaluation not completed due to transporter arrived to take pt to MRI. ? component of anxiety contributing to symptoms. If vertigo/spinning persists, recommend outpatient PT for further assessment.     PT Assessment  Patient needs continued PT services    Follow Up Recommendations  Outpatient PT (for vestibular rehab if vertigo/spinning persists)    Barriers to Discharge        lEquipment Recommendations  None recommended by PT    Recommendations for Other Services     Frequency Min 3X/week    Precautions / Restrictions Precautions Precautions: Fall   Pertinent Vitals/Pain 131/67 supine 127/83 sitting 120/72 standing      Mobility  Bed Mobility Bed Mobility: Rolling Right;Rolling Left;Supine to Sit;Sitting - Scoot to Edge of Bed Rolling Right: 6: Modified independent (Device/Increase time) (incr time) Rolling Left: 6: Modified independent (Device/Increase time) (incr time) Supine to Sit: 6: Modified independent (Device/Increase time);HOB flat (incr time due to chronic back problems) Sitting - Scoot to Edge of Bed: 7: Independent Transfers Transfers: Sit to Stand;Stand to Sit Sit to Stand: 4: Min guard;Without upper extremity assist;From bed;From toilet Stand to Sit: 4: Min guard;Without upper extremity assist;To bed;To  chair/3-in-1;To toilet Details for Transfer Assistance: No loss of balance with transfers x 3; + lightheadedness Ambulation/Gait Ambulation/Gait Assistance: 4: Min guard Ambulation Distance (Feet): 17 Feet (12 then 5) Assistive device: 1 person hand held assist Ambulation/Gait Assistance Details: Pt anxious re: feeling lightheaded and provided close guarding to bathroom; out of bathroom pt did not use HHA (guarding only) Gait Pattern: Within Functional Limits Gait velocity: decr    Exercises     PT Diagnosis: Difficulty walking  PT Problem List: Decreased balance;Decreased activity tolerance;Decreased mobility PT Treatment Interventions: DME instruction;Gait training;Stair training;Functional mobility training;Therapeutic activities;Therapeutic exercise;Balance training;Patient/family education   PT Goals Acute Rehab PT Goals PT Goal Formulation: With patient Time For Goal Achievement: 09/12/11 Potential to Achieve Goals: Good Pt will Roll Supine to Left Side: Independently (with dizziness <2/10) PT Goal: Rolling Supine to Left Side - Progress: Goal set today Pt will go Supine/Side to Sit: with modified independence;Other (comment) (dizziness <2/10) PT Goal: Supine/Side to Sit - Progress: Goal set today Pt will go Sit to Stand: with modified independence;Other (comment) (with dizziness <2/10) PT Goal: Sit to Stand - Progress: Goal set today Pt will Ambulate: 51 - 150 feet;with modified independence;with least restrictive assistive device;Other (comment) (with dizziness <2/10) PT Goal: Ambulate - Progress: Goal set today Pt will Go Up / Down Stairs: Flight;with supervision;with rail(s) (with dizziness <2/10) PT Goal: Up/Down Stairs - Progress: Goal set today Pt will Perform Home Exercise Program: with supervision, verbal cues required/provided (gaze stabilization exercises) PT Goal: Perform Home Exercise Program - Progress: Goal set today  Visit Information  Last PT Received On:  09/10/11 Assistance Needed: +1    Subjective Data  Subjective: Pt reports she had spinning sensation off/on over last  two weeks (including 5/26) but has only felt lightheaded today. Patient Stated Goal: To return home without dizziness   Prior Functioning  Home Living Lives With: Son Available Help at Discharge: Family;Available PRN/intermittently Type of Home: Other (Comment) (condo) Home Access: Level entry Home Layout: Two level Alternate Level Stairs-Number of Steps: 14 Alternate Level Stairs-Rails: Right Home Adaptive Equipment: None Prior Function Level of Independence: Independent Able to Take Stairs?: Reciprically Comments: states she has not fallen since dizziness/spinning began 2+ weeks ago; states if she begins to feel it, she holds onto something Communication Communication: No difficulties    Cognition  Overall Cognitive Status: Appears within functional limits for tasks assessed/performed Arousal/Alertness: Awake/alert Orientation Level: Appears intact for tasks assessed Behavior During Session: Anxious Cognition - Other Comments: Became nervous/anxious when she felt the lightheaded feeling; encouraged to take deep breaths and to fix eyes on a single target with improvement after 30-60 seconds    Extremity/Trunk Assessment Right Upper Extremity Assessment RUE ROM/Strength/Tone: [facility] for tasks assessed Left Upper Extremity Assessment LUE ROM/Strength/Tone: [facility] for tasks assessed Right Lower Extremity Assessment RLE ROM/Strength/Tone: [facility] for tasks assessed Left Lower Extremity Assessment LLE ROM/Strength/Tone: [facility] for tasks assessed   Balance Balance Balance Assessed: Yes Static Sitting Balance Static Sitting - Balance Support: No upper extremity supported;Feet unsupported Static Sitting - Level of Assistance: 7: Independent Static Standing Balance Static Standing - Balance Support: No upper extremity supported Static Standing - Level of Assistance: 5: Stand  by assistance (no sway noted while taking BP) Static Standing - Comment/# of Minutes: Further assessment deferred due to pt being transported to MRI   End of Session PT - End of Session Activity Tolerance: Patient tolerated treatment well Patient left: in chair;Other (comment) (in wheelchair with transporter) Nurse Communication: Mobility status;Other (comment) (BP decr with activity)   Kristy Cox 09/10/2011, 3:03 PM  Pager [phone]

## 2011-09-10 NOTE — Progress Notes (Signed)
Subjective: Feels well this morning, no complaints.  Did have one slight episode of dizziness when she turned over in bed too quickly-- nothing abnormal on tele.   Objective: Vital signs in last 24 hours: Temp:  [97.7 F (36.5 C)-98.8 F (37.1 C)] 97.9 F (36.6 C) (05/27 0600) Pulse Rate:  [56-66] 63  (05/27 0600) Resp:  [13-20] 14  (05/27 0600) BP: (111-161)/(61-87) 114/61 mmHg (05/27 0600) SpO2:  [95 %-100 %] 95 % (05/27 0600) Weight:  [143 lb 15.4 oz (65.3 kg)] 143 lb 15.4 oz (65.3 kg) (05/27 0037) Weight change:  Last BM Date: 09/09/11  Intake/Output from previous day: 05/26 0701 - 05/27 0700 In: -  Out: 500 [Urine:500] Intake/Output this shift:   PHYSICAL EXAM Gen: NAD, very pleasant HEENT: MMM CV: RRR, no murmur, no carotid bruits Pulm: clear, no wheezes Abd: soft, nontender Ext: Warm, no edema Neuro: grossly intact  Lab Results:  Basename 09/10/11 0156 09/09/11 1803  WBC 3.2* 3.6*  HGB 11.3* 11.8*  HCT 34.4* 35.9*  PLT 178 187   BMET  Basename 09/10/11 0156 09/09/11 1803  NA -- 142  K -- 3.2*  CL -- 102  CO2 -- 28  GLUCOSE -- 95  BUN -- 12  CREATININE 0.84 0.84  CALCIUM -- 10.7*    Studies/Results: No results found.  Medications:  I have reviewed the patient's current medications. Scheduled:   . aspirin EC  81 mg Oral Daily  . heparin  5,000 Units Subcutaneous Q8H  . losartan  100 mg Oral Daily   And  . hydrochlorothiazide  25 mg Oral Daily  . latanoprost  1 drop Both Eyes QHS  . NIFEdipine  30 mg Oral Daily  . ondansetron  4 mg Oral Once  . pantoprazole  40 mg Oral Daily  . potassium chloride  40 mEq Oral Once  . simvastatin  40 mg Oral QHS  . sodium chloride  750 mL Intravenous Once  . sodium chloride  3 mL Intravenous Q12H  . DISCONTD: losartan-hydrochlorothiazide  1 tablet Oral Daily   Continuous:   . sodium chloride 100 mL/hr at 09/10/11 0047   ZOX:[id number], acetaminophen  Assessment/Plan: [name] with PMHx significant  for HTN, HLD and PVCs presents with dizziness   1. Dizziness: vertigo/labrinthytis vs vasovagal vs orthostatic hypotension vs other  -Positive orthostatic vital signs in the ED without apparent reason for dehydration/volume depletion (no recent illnesses or decreased po)- lying: 131/73, 56 --> sitting: 133/78, 62 --> standing 111/80, 66  -Tele to monitor for dysrhythmias --> no abnormalities overnight -Check TSH -AM EKG not yet done, will follow up -Consider meclizine trial today (12.5mg ) vs as an outpatient  -Will get MRI head to r/o stroke with this both vertiginous and orthostatic-like dizziness -PT eval  2. Hypokalemia: 3.2 --> 3.5 -Received po K-dur in ED  -Repeat BMET today showing resolution    3. HTN, h/o PVCs: lying BPs slightly elevated, but standing BPs normal; no PVCs on EKG  -Home regimen: hyzaar 100/25, procardia 30; was on metoprolol but has not taken in >2 months  -BPs seem somewhat labile (most recent 114/61, was 152/85 before that); pt reports it is usually 110-120 systolic at home, so 140s-150s is very high for her -d/c home nifedipine   4. HLD:  -home regimen zocor 40  -continue while in house   FEN/GI: heart healthy diet ad lib, SLIV Ppx: SQ heparin, protonix (home med)  Dispo: probably d/c this afternoon after a little more monitoring  on tele and hope for vertigo/dizzy spell to r/o cardiac etiology    LOS: 1 day   Kristy Cox 09/10/2011, 7:23 AM

## 2011-09-11 MED ORDER — LOSARTAN POTASSIUM 50 MG PO TABS
100.0000 mg | ORAL_TABLET | Freq: Every day | ORAL | Status: DC
  Administered 2011-09-11: 100 mg via ORAL
  Filled 2011-09-11: qty 2

## 2011-09-11 MED ORDER — HYDROCHLOROTHIAZIDE 25 MG PO TABS
25.0000 mg | ORAL_TABLET | Freq: Every day | ORAL | Status: DC
  Administered 2011-09-11: 25 mg via ORAL
  Filled 2011-09-11: qty 1

## 2011-09-11 MED ORDER — MECLIZINE HCL 12.5 MG PO TABS: 12.5000 mg | ORAL_TABLET | Freq: Two times a day (BID) | ORAL | Status: AC

## 2011-09-11 MED ORDER — LOSARTAN POTASSIUM-HCTZ 100-25 MG PO TABS: 0.5000 | ORAL_TABLET | Freq: Every day | ORAL | Status: DC

## 2011-09-11 NOTE — Discharge Instructions (Signed)

## 2011-09-11 NOTE — Discharge Summary (Signed)
I have reviewed this discharge summary and agree.    

## 2011-09-11 NOTE — Progress Notes (Signed)
UR Completed Jordana Dugue Graves-Bigelow, RN,BSN [phone]  

## 2011-09-11 NOTE — Care Management Note (Signed)
    Page 1 of 1   09/11/2011     9:32:25 AM   CARE MANAGEMENT NOTE 09/11/2011  Patient:  Kristy Cox, Kristy Cox   Account Number:  [id number]  Date Initiated:  09/11/2011  Documentation initiated by:  GRAVES-BIGELOW,Leidy Massar  Subjective/Objective Assessment:   pt admitted with dizziness and nausea. MD has set pt up [facility]     Action/Plan:   No needs form CM at this time.   Anticipated DC Date:  09/11/2011   Anticipated DC Plan:  HOME/SELF CARE      DC Planning Services  CM consult      Choice offered to / List presented to:             Status of service:  Completed, signed off Medicare Important Message given?   (If response is "NO", the following Medicare IM given date fields will be blank) Date Medicare IM given:   Date Additional Medicare IM given:    Discharge Disposition:  HOME/SELF CARE  Per UR Regulation:    If discussed at Long Length of Stay Meetings, dates discussed:    Comments:

## 2011-09-11 NOTE — Progress Notes (Signed)
OT Discharge Note  Patient is being discharged from OT services secondary to:    Pt d/cing this AM with outpatient orders for vestibular rehab   OT to defer all treatment to outpatient at this time due to pending d/c    OT to sign off acutely    Lucile Shutters   OTR/L Pager: [phone] Office: [phone] .

## 2011-09-11 NOTE — Progress Notes (Signed)
PT Discharge Note  Patient is being discharged from PT services secondary to:    Goals met and no further therapy needs identified.  Please see latest Therapy Progress Note (below) for current level of functioning and progress toward goals.  Progress and discharge plan and discussed with patient/caregiver and they    Agree  Physical Therapy Progress Note Patient Details Name: Kristy Cox MRN: [id number] DOB: February 18, 1946 Today's Date: 09/11/2011 Time: 4098-1191 PT Time Calculation (min): 17 min  PT Assessment / Plan / Recommendation Comments on Treatment Session  Pt preparing for discharge. Assessed BP before and after ambulation with increase in BP (normal response). Completed elements of the Dynamic Gait Index with pt demonstrating no imbalance or unsteadiness. Discussed my recommendation for OPPT yesterday due to incomplete assessment and that currently she does not need OPPT as she is not experiencing vertigo or imbalance. (Pt had not had meclizine since 5 pm yesterday and had no vertigo yesterday prior to meclizine). Educated pt that she should discuss with MD when to stop meclizine and may need OPPT at that time IF vertigo returns.     Follow Up Recommendations  No PT follow up    Barriers to Discharge        Equipment Recommendations  None recommended by PT    Recommendations for Other Services    Frequency     Plan Discharge plan needs to be updated;All goals met and education completed, patient dischaged from PT services    Precautions / Restrictions Precautions Precautions: None   Pertinent Vitals/Pain BP with normal response to activity--see doc flowsheets    Mobility  Bed Mobility Bed Mobility: Supine to Sit;Sitting - Scoot to Edge of Bed Supine to Sit: 6: Modified independent (Device/Increase time) Sitting - Scoot to Edge of Bed: 7: Independent Transfers Transfers: Sit to Stand;Stand to Sit Sit to Stand: 7: Independent Stand to Sit: 7:  Independent Details for Transfer Assistance: No unsteadiness noted Ambulation/Gait Ambulation/Gait Assistance: 5: Supervision;7: Independent (supervision progressing to Independent) Ambulation Distance (Feet): 200 Feet Assistive device: None Ambulation/Gait Assistance Details: see DGI Gait Pattern: Within Functional Limits Gait velocity: WFL    Exercises     PT Diagnosis:    PT Problem List:   PT Treatment Interventions:     PT Goals Acute Rehab PT Goals PT Goal: Supine/Side to Sit - Progress: Met PT Goal: Sit to Stand - Progress: Met PT Goal: Ambulate - Progress: Met PT Goal: Perform Home Exercise Program - Progress: Discontinued (comment) (did not require vestibular exercises)  Visit Information  Last PT Received On: 09/11/11 Assistance Needed: +1    Subjective Data  Subjective: States she has not felt any spinning yesterday or today. Does not feel lightheaded, and does not feel quite right "like a weight is on my head, not a very heavy one."   Cognition  Overall Cognitive Status: Appears within functional limits for tasks assessed/performed Arousal/Alertness: Awake/alert Orientation Level: Appears intact for tasks assessed Behavior During Session: [facility] for tasks performed    Balance  Balance Balance Assessed: Yes Standardized Balance Assessment Standardized Balance Assessment: Dynamic Gait Index Dynamic Gait Index Level Surface: Normal Change in Gait Speed: Mild Impairment (able to accelerate, but not significantly; able to sig decr ) Gait with Horizontal Head Turns: Normal Gait with Vertical Head Turns: Normal Gait and Pivot Turn: Normal Step Over Obstacle: Mild Impairment Step Around Obstacles: Normal Steps:  (nt)  End of Session PT - End of Session Activity Tolerance: Patient tolerated treatment well Patient left:  in chair;with call bell/phone within reach Nurse Communication: Mobility status;Other (comment) (instructed pt she does not need OPPT currently (no  vertigo))    Giulio Bertino 09/11/2011, 10:46 AM Pager [phone]

## 2011-09-11 NOTE — Progress Notes (Signed)
Pt received discharge instructions with no further questions. Will be out of town for scheduled appointment and knows to call and reschedule prior to trip. Stable for DC. Per PT, no vertigo today. She knows to call [facility] to set up appointment if vertigo comes back and has number and address. Is aware of decrease in BP medication and will check BP daily and write down to take to follow up appointment. Also is aware of instructions for meclizine and when to call the doctor. Went over emergency situations and symptoms of heart attack/stroke. Stable for DC. Duwaine Maxin, RN  Pt was to wait for volunteer to take out in wheelchair, but son came up and both were gone when I re-entered the room.

## 2011-09-18 ENCOUNTER — Ambulatory Visit: Payer: Medicare Other | Admitting: Family Medicine

## 2011-09-28 ENCOUNTER — Ambulatory Visit: Payer: Medicare Other | Admitting: Family Medicine

## 2011-12-12 ENCOUNTER — Encounter (HOSPITAL_COMMUNITY): Payer: Self-pay | Admitting: Emergency Medicine

## 2011-12-12 ENCOUNTER — Emergency Department (HOSPITAL_COMMUNITY): Payer: Self-pay

## 2011-12-12 ENCOUNTER — Emergency Department (HOSPITAL_COMMUNITY)
Admission: EM | Admit: 2011-12-12 | Discharge: 2011-12-12 | Disposition: A | Discharge status: Home / Self Care | Payer: Self-pay | Attending: Emergency Medicine | Admitting: Emergency Medicine

## 2011-12-12 DIAGNOSIS — G629 Polyneuropathy, unspecified: Secondary | ICD-10-CM

## 2011-12-12 DIAGNOSIS — M810 Age-related osteoporosis without current pathological fracture: Secondary | ICD-10-CM | POA: Insufficient documentation

## 2011-12-12 DIAGNOSIS — I10 Essential (primary) hypertension: Secondary | ICD-10-CM | POA: Insufficient documentation

## 2011-12-12 DIAGNOSIS — G5790 Unspecified mononeuropathy of unspecified lower limb: Secondary | ICD-10-CM | POA: Insufficient documentation

## 2011-12-12 DIAGNOSIS — K219 Gastro-esophageal reflux disease without esophagitis: Secondary | ICD-10-CM | POA: Insufficient documentation

## 2011-12-12 DIAGNOSIS — E7800 Pure hypercholesterolemia, unspecified: Secondary | ICD-10-CM | POA: Insufficient documentation

## 2011-12-12 MED ORDER — GABAPENTIN 300 MG PO CAPS: ORAL_CAPSULE | ORAL | Status: DC

## 2011-12-12 NOTE — ED Provider Notes (Signed)
History   This chart was scribed for Nelia Shi, MD by Sofie Rower. The patient was seen in room TR08C/TR08C and the patient's care was started at 12:36 PM     CSN: [id number]  Arrival date & time 12/12/11  1204   First MD Initiated Contact with Patient 12/12/11 1235      Chief Complaint  Patient presents with  . Foot Pain    Left foot     Patient is a 66 y.o. female presenting with lower extremity pain. The history is provided by the patient. No language interpreter was used.  Foot Pain This is a new problem. The current episode started more than 1 week ago. The problem occurs constantly. The problem has been gradually worsening. Pertinent negatives include no chest pain, no abdominal pain, no headaches and no shortness of breath. The symptoms are aggravated by walking. Nothing relieves the symptoms. She has tried nothing for the symptoms. The treatment provided no relief.    Kristy Cox is a 66 y.o. female, who presents to the Emergency Department complaining of  gradual, progressively worsening, foot pain located at the left foot onset three weeks ago with associated symptoms of a burning sensation located at the left foot. Modifying factors include walking which intensifies the foot pain. The pt has a hx of hypertension and osteoporosis. The pt denies diabetes.   The pt does not smoke or drink alcohol.   PCP is Dr. Ashley Royalty.    Past Medical History  Diagnosis Date  . Acid reflux   . Hypertension   . Hypercholesteremia   . Osteoporosis   . Irregular heart beat     PVC's    History reviewed. No pertinent past surgical history.  Family History  Problem Relation Age of Onset  . Breast cancer Mother   . Lung cancer Sister   . Coronary artery disease      History  Substance Use Topics  . Smoking status: Never Smoker   . Smokeless tobacco: Never Used  . Alcohol Use: No    OB History    Grav Para Term Preterm Abortions TAB SAB Ect Mult Living                  Review of Systems  Respiratory: Negative for shortness of breath.   Cardiovascular: Negative for chest pain.  Gastrointestinal: Negative for abdominal pain.  Neurological: Negative for headaches.  All other systems reviewed and are negative.    Allergies  Penicillins  Home Medications   Current Outpatient Rx  Name Route Sig Dispense Refill  . ALENDRONATE SODIUM 70 MG PO TABS Oral Take 70 mg by mouth every 7 (seven) days. Take with a full glass of water on an empty stomach. Take on Thursday    . ASPIRIN EC 81 MG PO TBEC Oral Take 81 mg by mouth daily.      Marland Kitchen CALCIUM CARBONATE-VITAMIN D 500-200 MG-UNIT PO TABS Oral Take 1 tablet by mouth 2 (two) times daily.      Marland Kitchen LATANOPROST 0.005 % OP SOLN Both Eyes Place 1 drop into both eyes at bedtime.      Marland Kitchen LOSARTAN POTASSIUM-HCTZ 100-25 MG PO TABS Oral Take 0.5 tablets by mouth daily. 30 tablet 3  . METOPROLOL TARTRATE 25 MG PO TABS Oral Take 12.5 mg by mouth daily.    Marland Kitchen PANTOPRAZOLE SODIUM 40 MG PO TBEC Oral Take 40 mg by mouth daily.      Marland Kitchen SIMVASTATIN 40 MG PO TABS Oral  Take 40 mg by mouth at bedtime.      Marland Kitchen GABAPENTIN 300 MG PO CAPS  Day 1: 300 mg PO qD Day 2: 300 mg PO BID Day 3: 300 mg PO TID 100 capsule 0    BP 150/68  Pulse 59  Temp 98.3 F (36.8 C) (Oral)  Resp 20  SpO2 98%  Physical Exam  Nursing note and vitals reviewed. Constitutional: She is oriented to person, place, and time. She appears well-developed. No distress.  HENT:  Head: Normocephalic and atraumatic.  Eyes: Pupils are equal, round, and reactive to light.  Neck: Normal range of motion.  Cardiovascular: Normal rate and intact distal pulses.   Pulmonary/Chest: No respiratory distress.  Abdominal: Normal appearance. She exhibits no distension.  Musculoskeletal: Normal range of motion.        Area of burning pain  on plantar surface left foot.  Neurological: She is alert and oriented to person, place, and time. No cranial nerve deficit.  Skin: Skin is  warm and dry. No rash noted.  Psychiatric: She has a normal mood and affect. Her behavior is normal.    ED Course  Procedures (including critical care time)  DIAGNOSTIC STUDIES: Oxygen Saturation is 98% on room air, normal by my interpretation.    COORDINATION OF CARE:    12:39PM- X-ray of left foot discussed. Pt agrees to treatment.   Labs Reviewed - No data to display Dg Foot Complete Left  12/12/2011  *RADIOLOGY REPORT*  Clinical Data: Burning sensation at inferior foot near toes, pain for 3 weeks  LEFT FOOT - COMPLETE 3+ VIEW  Comparison: None.  Findings: Bones demineralized. Subchondral cyst at first metatarsal head. Joint spaces grossly preserved. No acute fracture, dislocation or bone destruction. No soft tissue abnormalities are radiographically evident.  IMPRESSION: Probable mild degenerative changes first MTP joint. No acute abnormalities.   Original Report Authenticated By: Lollie Marrow, M.D.       1. Neuropathy       MDM         I personally performed the services described in this documentation, which was scribed in my presence. The recorded information has been reviewed and considered.    Nelia Shi, MD 12/12/11 [phone]

## 2011-12-12 NOTE — ED Notes (Signed)
Pt has burning pain  On ball of foot hurts to walk on it she states no injury has good neuro and sensation color and warmth can wiggle toes good pulse

## 2011-12-12 NOTE — ED Notes (Signed)
Pt reports 3 weeks of burning pain to bottom of left foot. Burning sensation increases with ambulation. Pt denies any open areas on bottom of foot.

## 2011-12-19 ENCOUNTER — Ambulatory Visit (INDEPENDENT_AMBULATORY_CARE_PROVIDER_SITE_OTHER): Payer: Medicare Other | Admitting: Family Medicine

## 2011-12-19 ENCOUNTER — Encounter: Payer: Self-pay | Admitting: Family Medicine

## 2011-12-19 VITALS — BP 161/89 | HR 54 | Ht 65.0 in | Wt 148.0 lb

## 2011-12-19 DIAGNOSIS — M79673 Pain in unspecified foot: Secondary | ICD-10-CM

## 2011-12-19 DIAGNOSIS — M79672 Pain in left foot: Secondary | ICD-10-CM | POA: Insufficient documentation

## 2011-12-19 DIAGNOSIS — M79609 Pain in unspecified limb: Secondary | ICD-10-CM

## 2011-12-19 NOTE — Patient Instructions (Addendum)
Thank you for coming in today, it was good to see you The pain in your foot could be from a few things. I would like for you to be seen at our sports medicine center, they will call to arrange that appointment for you.  For now soak your foot daily in an ice bath to see if that helps improve the pain.

## 2011-12-24 NOTE — Assessment & Plan Note (Signed)
Patient with pain in L foot and history of osteoporosis.  Films in ED negative for any fracture, however did show degenerative changes at first MTP joint and Subchondral cyst at first metatarsal head.  Cyst could be contributing to pain.  Will refer to sports medicine for see if this may be able to be better visualized on MSK [place] and possibly have  Insole with additional padding made.

## 2011-12-24 NOTE — Progress Notes (Signed)
  Subjective:    Patient ID: Kristy Cox, female    DOB: 12-04-1945, 66 y.o.   MRN: [id number]  HPI  1. Foot pain:   C/o of L foot pain x1 month.  Pain is in forefoot along plantar aspect, between 1st and 2nd toes. Describes pain as "burning" pain in foot that is made worse by walking. Was evaluated in ED about a week ago and given rx for neurontin, which as far as she can tell has not made a difference.  She denies trauma, swelling,  numbness, tingling into foot.  Her medical history is significant for osteoporosis.  Review of Systems Denies fever, chills, pain in other joints/    Objective:   Physical Exam  Constitutional: She appears well-nourished. No distress.  Neck: Neck supple.  Musculoskeletal:       L foot:   Foot appears normal to inspection, no swelling noted With palpation she exhibits tenderness on the plantar aspect of the forefoot, most notably along the head of the 1st metatarsal.  Strength is 5/5.  Her pulses are palpable and she has normal sensation bilaterally.            Assessment & Plan:

## 2011-12-25 ENCOUNTER — Ambulatory Visit (INDEPENDENT_AMBULATORY_CARE_PROVIDER_SITE_OTHER): Payer: Medicare Other | Admitting: Sports Medicine

## 2011-12-25 ENCOUNTER — Encounter: Payer: Self-pay | Admitting: Sports Medicine

## 2011-12-25 VITALS — BP 116/71 | HR 56 | Ht 65.0 in | Wt 140.0 lb

## 2011-12-25 DIAGNOSIS — M7742 Metatarsalgia, left foot: Secondary | ICD-10-CM

## 2011-12-25 DIAGNOSIS — M79673 Pain in unspecified foot: Secondary | ICD-10-CM

## 2011-12-25 DIAGNOSIS — M79609 Pain in unspecified limb: Secondary | ICD-10-CM

## 2011-12-25 DIAGNOSIS — M7750 Other enthesopathy of unspecified foot: Secondary | ICD-10-CM

## 2011-12-25 NOTE — Progress Notes (Signed)
.    [facility]  Patient name: Kristy Cox MRN [id number]  Date of birth: Mar 07, 1946  CC & HPI:  Kristy Cox is a 66 y.o. female presenting today for evaluation of left foot pain.  She reports that the pain is over the second and third MCP joints and is described as a hurting and burning. Her pain is worse with walking.  The pain does affect her gait This happens 4-5 times per week however she will occasionally have complete resolution between episodes.  This is been going on for 6 weeks.  She reports no injury.  Not taking any medications for pain.  She does ice on occasion only has minimal improvement.    ROS:  No falls, no loss of range of motion.  Pertinent History Reviewed:  Medical & Surgical Hx:  Reviewed: Significant for hypertension hyperlipidemia, osteoporosis Medications: Reviewed & Updated - see associated section Social History: Reviewed - Significant for nonsmoker  Objective Findings:  Vitals:  Filed Vitals:   12/25/11 0916  BP: 116/71  Pulse: 56    PE: GENERAL:  Adult female. In no discomfort; no respiratory distress. FOOT EXAM:: Bilateral transverse arch collapse, L worse than R.  Longitudinal arch preserved, Calcaneus is neutral, mild to moderate toe splaying.  Morton's callus present bilaterally.  Antalgic gait due to favoring L foot.  Tenderness to palpation over the second and third MTP joints.  No pain with squeeze, no pain with palpation over third interspace. No neuroma appreciated. Neurovascularly intact.    Assessment & Plan:

## 2011-12-26 DIAGNOSIS — M7742 Metatarsalgia, left foot: Secondary | ICD-10-CM | POA: Insufficient documentation

## 2011-12-26 NOTE — Assessment & Plan Note (Addendum)
Patient has bilateral transverse arch collapse.  More significant on the left were she is symptomatic.  She will be provided with metatarsal cushioning to offload the MTP joint.  These were tried in the office and the patient reports significant improvement.    Patient will follow up in 4 weeks. May consider custom orthotics at that time.

## 2011-12-26 NOTE — Assessment & Plan Note (Signed)
See the foot pain section

## 2012-01-31 ENCOUNTER — Ambulatory Visit: Payer: Medicare Other | Admitting: Sports Medicine

## 2012-04-25 ENCOUNTER — Emergency Department (HOSPITAL_COMMUNITY)
Admission: EM | Admit: 2012-04-25 | Discharge: 2012-04-25 | Disposition: A | Discharge status: Home / Self Care | Payer: Medicare Other | Attending: Emergency Medicine | Admitting: Emergency Medicine

## 2012-04-25 ENCOUNTER — Encounter (HOSPITAL_COMMUNITY): Payer: Self-pay | Admitting: *Deleted

## 2012-04-25 ENCOUNTER — Emergency Department (HOSPITAL_COMMUNITY): Payer: Medicare Other

## 2012-04-25 DIAGNOSIS — K219 Gastro-esophageal reflux disease without esophagitis: Secondary | ICD-10-CM | POA: Insufficient documentation

## 2012-04-25 DIAGNOSIS — R0602 Shortness of breath: Secondary | ICD-10-CM | POA: Insufficient documentation

## 2012-04-25 DIAGNOSIS — E7800 Pure hypercholesterolemia, unspecified: Secondary | ICD-10-CM | POA: Insufficient documentation

## 2012-04-25 DIAGNOSIS — M810 Age-related osteoporosis without current pathological fracture: Secondary | ICD-10-CM | POA: Insufficient documentation

## 2012-04-25 DIAGNOSIS — R05 Cough: Secondary | ICD-10-CM

## 2012-04-25 DIAGNOSIS — J029 Acute pharyngitis, unspecified: Secondary | ICD-10-CM | POA: Insufficient documentation

## 2012-04-25 DIAGNOSIS — R062 Wheezing: Secondary | ICD-10-CM | POA: Insufficient documentation

## 2012-04-25 DIAGNOSIS — Z7982 Long term (current) use of aspirin: Secondary | ICD-10-CM | POA: Insufficient documentation

## 2012-04-25 DIAGNOSIS — R5381 Other malaise: Secondary | ICD-10-CM | POA: Insufficient documentation

## 2012-04-25 DIAGNOSIS — R51 Headache: Secondary | ICD-10-CM | POA: Insufficient documentation

## 2012-04-25 DIAGNOSIS — I517 Cardiomegaly: Secondary | ICD-10-CM | POA: Insufficient documentation

## 2012-04-25 DIAGNOSIS — R0982 Postnasal drip: Secondary | ICD-10-CM | POA: Insufficient documentation

## 2012-04-25 DIAGNOSIS — IMO0001 Reserved for inherently not codable concepts without codable children: Secondary | ICD-10-CM | POA: Insufficient documentation

## 2012-04-25 DIAGNOSIS — J3489 Other specified disorders of nose and nasal sinuses: Secondary | ICD-10-CM | POA: Insufficient documentation

## 2012-04-25 DIAGNOSIS — Z79899 Other long term (current) drug therapy: Secondary | ICD-10-CM | POA: Insufficient documentation

## 2012-04-25 DIAGNOSIS — R059 Cough, unspecified: Secondary | ICD-10-CM | POA: Insufficient documentation

## 2012-04-25 DIAGNOSIS — I10 Essential (primary) hypertension: Secondary | ICD-10-CM | POA: Insufficient documentation

## 2012-04-25 DIAGNOSIS — J069 Acute upper respiratory infection, unspecified: Secondary | ICD-10-CM | POA: Insufficient documentation

## 2012-04-25 MED ORDER — GUAIFENESIN ER 600 MG PO TB12: 1200.0000 mg | ORAL_TABLET | Freq: Two times a day (BID) | ORAL | Status: DC

## 2012-04-25 MED ORDER — HYDROCODONE-HOMATROPINE 5-1.5 MG/5ML PO SYRP: 5.0000 mL | ORAL_SOLUTION | Freq: Four times a day (QID) | ORAL | Status: DC | PRN

## 2012-04-25 MED ORDER — IPRATROPIUM BROMIDE 0.03 % NA SOLN: 2.0000 | Freq: Two times a day (BID) | NASAL | Status: DC

## 2012-04-25 MED ORDER — AZITHROMYCIN 250 MG PO TABS: 250.0000 mg | ORAL_TABLET | Freq: Every day | ORAL | Status: DC

## 2012-04-25 NOTE — ED Notes (Signed)
Pt reporting aching,nasal congestion cough x 3 weeks, has gotten worse. Today developed runny nose.

## 2012-04-25 NOTE — ED Provider Notes (Signed)
History     CSN: [id number]  Arrival date & time 04/25/12  0909   First MD Initiated Contact with Patient 04/25/12 1011      Chief Complaint  Patient presents with  . Cough  . Generalized Body Aches  . Back Pain    (Consider location/radiation/quality/duration/timing/severity/associated sxs/prior treatment) Patient is a 67 y.o. female presenting with back pain. The history is provided by the patient and medical records.  Back Pain  Associated symptoms include headaches. Pertinent negatives include no chest pain, no fever, no numbness, no abdominal pain and no dysuria.    Kristy Cox is a 67 y.o. female  with a hx of GERD, HTN, hypercholesterolemia, osteoporosis and irregular HR presents to the Emergency Department complaining of gradual, persistent, progressively worsening cough and congestion onset 3 weeks ago. Associated symptoms include green phlegm, myalgias, chills, fatigue and generally ill feeling, nasal congestion, epistaxis x1.  Pt states has not had any known sick contacts and did receive a flu shot this year.  She has tried delsym for her cough without relief.  Nothing makes it better and nothing makes it worse.  Pt denies fever, headache, chest pain, shortness of breath, abdominal pain, nausea, vomiting, diarrhea, dizziness, syncope, dysuria, hematuria,. Hematochezia.       Past Medical History  Diagnosis Date  . Acid reflux   . Hypertension   . Hypercholesteremia   . Osteoporosis   . Irregular heart beat     PVC's    History reviewed. No pertinent past surgical history.  Family History  Problem Relation Age of Onset  . Breast cancer Mother   . Lung cancer Sister   . Coronary artery disease      History  Substance Use Topics  . Smoking status: Never Smoker   . Smokeless tobacco: Never Used  . Alcohol Use: No    OB History    Grav Para Term Preterm Abortions TAB SAB Ect Mult Living                  Review of Systems  Constitutional: Positive  for fatigue. Negative for fever, chills and appetite change.  HENT: Positive for congestion, sore throat, rhinorrhea, postnasal drip and sinus pressure. Negative for mouth sores and neck stiffness.   Eyes: Negative for visual disturbance.  Respiratory: Positive for cough, chest tightness, shortness of breath and wheezing. Negative for stridor.   Cardiovascular: Negative for chest pain, palpitations and leg swelling.  Gastrointestinal: Negative for nausea, vomiting, abdominal pain and diarrhea.  Genitourinary: Negative for dysuria, urgency, frequency and hematuria.  Musculoskeletal: Positive for myalgias. Negative for back pain and arthralgias.  Skin: Negative for rash.  Neurological: Positive for headaches. Negative for syncope, light-headedness and numbness.  Hematological: Negative for adenopathy.  Psychiatric/Behavioral: The patient is not nervous/anxious.   All other systems reviewed and are negative.    Allergies  Penicillins  Home Medications   Current Outpatient Rx  Name  Route  Sig  Dispense  Refill  . ALENDRONATE SODIUM 70 MG PO TABS   Oral   Take 70 mg by mouth every 7 (seven) days. Take with a full glass of water on an empty stomach. Take on Thursday         . ASPIRIN EC 81 MG PO TBEC   Oral   Take 81 mg by mouth daily.           Marland Kitchen CALCIUM CARBONATE-VITAMIN D 500-200 MG-UNIT PO TABS   Oral   Take 1 tablet by  mouth 2 (two) times daily.          Marland Kitchen LATANOPROST 0.005 % OP SOLN   Both Eyes   Place 1 drop into both eyes at bedtime.           Marland Kitchen LOSARTAN POTASSIUM-HCTZ 100-25 MG PO TABS   Oral   Take 0.5 tablets by mouth daily.   30 tablet   3   . METOPROLOL TARTRATE 25 MG PO TABS   Oral   Take 12.5 mg by mouth daily.         Marland Kitchen PANTOPRAZOLE SODIUM 40 MG PO TBEC   Oral   Take 40 mg by mouth daily.           Marland Kitchen SIMVASTATIN 40 MG PO TABS   Oral   Take 40 mg by mouth at bedtime.           . AZITHROMYCIN 250 MG PO TABS   Oral   Take 1 tablet (250  mg total) by mouth daily. Take first 2 tablets together, then 1 every day until finished.   6 tablet   0   . GUAIFENESIN ER 600 MG PO TB12   Oral   Take 2 tablets (1,200 mg total) by mouth 2 (two) times daily.   20 tablet   0   . HYDROCODONE-HOMATROPINE 5-1.5 MG/5ML PO SYRP   Oral   Take 5 mLs by mouth every 6 (six) hours as needed for cough.   120 mL   0   . IPRATROPIUM BROMIDE 0.03 % NA SOLN   Nasal   Place 2 sprays into the nose 2 (two) times daily. PRN congestion   30 mL   0     BP 129/63  Pulse 63  Temp 98.3 F (36.8 C) (Oral)  Resp 18  Ht 5\' 6"  (1.676 m)  Wt 135 lb (61.236 kg)  BMI 21.79 kg/m2  SpO2 98%  Physical Exam  Constitutional: She is oriented to person, place, and time. She appears well-developed and well-nourished. No distress.  HENT:  Head: Normocephalic and atraumatic.  Right Ear: Tympanic membrane, external ear and ear canal normal.  Left Ear: Tympanic membrane, external ear and ear canal normal.  Nose: Mucosal edema and rhinorrhea present. No epistaxis. Right sinus exhibits no maxillary sinus tenderness and no frontal sinus tenderness. Left sinus exhibits no maxillary sinus tenderness and no frontal sinus tenderness.  Mouth/Throat: Uvula is midline, oropharynx is clear and moist and mucous membranes are normal. Mucous membranes are not pale and not cyanotic. No oropharyngeal exudate, posterior oropharyngeal edema, posterior oropharyngeal erythema or tonsillar abscesses.  Eyes: Conjunctivae normal are normal. Pupils are equal, round, and reactive to light.  Neck: Normal range of motion and full passive range of motion without pain.  Cardiovascular: Normal rate, regular rhythm, normal heart sounds and intact distal pulses.  Exam reveals no gallop and no friction rub.   No murmur heard.      No PVCs noted today  Pulmonary/Chest: Effort normal and breath sounds normal. No stridor. No respiratory distress. She has no wheezes. She has no rales. She exhibits  tenderness (mild, throughout).  Abdominal: Soft. Bowel sounds are normal. She exhibits no distension and no mass. There is no tenderness. There is no rebound and no guarding.  Musculoskeletal: Normal range of motion. She exhibits no edema.  Lymphadenopathy:    She has no cervical adenopathy.  Neurological: She is alert and oriented to person, place, and time. She exhibits normal muscle tone. Coordination  normal.  Skin: Skin is warm and dry. No rash noted. She is not diaphoretic.  Psychiatric: She has a normal mood and affect.    ED Course  Procedures (including critical care time)  Labs Reviewed - No data to display Dg Chest 2 View  04/25/2012  *RADIOLOGY REPORT*  Clinical Data: Productive cough x 3 weeks.  Full body ache and upper back pain.  ? Fever.  Hx HTN and tachycardia.  CHEST - 2 VIEW  Comparison: 03/29/2011  Findings: Cardiomegaly noted with cardiothoracic index 59%.  Mild levoconvex lower thoracic scoliosis.  Right paratracheal density is chronic and likely due to vasculature.  No pleural effusion or edema.  Mild chronic mid thoracic wedging.  IMPRESSION:  1.  Cardiomegaly, without edema. 2.  Mild chronic mid thoracic wedging. 3.  No discrete airspace opacity is identified.   Original Report Authenticated By: Gaylyn Rong, M.D.      1. URI (upper respiratory infection)   2. Productive cough   3. HTN (hypertension)   4. Cardiomegaly       MDM  Andi Devon presents with 3 weeks of cough and sinus congestion.  Symptoms have been persistent and have not improved.  Chest x-ray without discrete airspace opacity.  I personally reviewed the x-ray findings and are consistent with a pneumonia.  Patients symptoms are consistent with URI, likely viral etiology, but do to length of the symptoms and worsening may have the potential to make sure component.  Pt will be discharged with symptomatic treatment and azithromycin.  I discussed the importance of following up with her primary  care physician at [facility] for further evaluation to ensure that she is improving. Verbalizes understanding and is agreeable with plan. Pt is hemodynamically stable, non-tachycardic, nontoxic, nonseptic appearing & in NAD prior to dc.  1. Medications: Hycodan, azithromycin, Atrovent, Mucinex, usual home medications 2. Treatment: rest, drink plenty of fluids, take medications as prescribed 3. Follow Up: Please followup with your primary doctor for discussion of your diagnoses and further evaluation after today's visit;         Dahlia Client Annaleigha Woo, PA-C 04/25/12 1109

## 2012-04-25 NOTE — ED Notes (Signed)
Patient transported to X-ray 

## 2012-04-25 NOTE — ED Provider Notes (Signed)
Medical screening examination/treatment/procedure(s) were performed by non-physician practitioner and as supervising physician I was immediately available for consultation/collaboration.   Glynn Octave, MD 04/25/12 [id number]

## 2012-04-25 NOTE — ED Notes (Signed)
Patient states she has had a cough for 3 weeks.  She states she has some production.  She is now having body aches for 2 weeks.  She has tried delsym w/o relief.  Patient reports onset of feeling cold on yesterday.  Patient is afebrile at this time.  She states the phlegm/sputum is light green.  Patient is also complaining of back pain.  Patient denies hx of pneumonia.

## 2012-07-18 ENCOUNTER — Emergency Department (HOSPITAL_COMMUNITY)
Admission: EM | Admit: 2012-07-18 | Discharge: 2012-07-18 | Disposition: A | Discharge status: Home / Self Care | Payer: Medicare Other | Attending: Emergency Medicine | Admitting: Emergency Medicine

## 2012-07-18 ENCOUNTER — Encounter (HOSPITAL_COMMUNITY): Payer: Self-pay

## 2012-07-18 DIAGNOSIS — I10 Essential (primary) hypertension: Secondary | ICD-10-CM | POA: Insufficient documentation

## 2012-07-18 DIAGNOSIS — Z8739 Personal history of other diseases of the musculoskeletal system and connective tissue: Secondary | ICD-10-CM | POA: Insufficient documentation

## 2012-07-18 DIAGNOSIS — K219 Gastro-esophageal reflux disease without esophagitis: Secondary | ICD-10-CM | POA: Insufficient documentation

## 2012-07-18 DIAGNOSIS — Z79899 Other long term (current) drug therapy: Secondary | ICD-10-CM | POA: Insufficient documentation

## 2012-07-18 DIAGNOSIS — Z8679 Personal history of other diseases of the circulatory system: Secondary | ICD-10-CM | POA: Insufficient documentation

## 2012-07-18 DIAGNOSIS — E7800 Pure hypercholesterolemia, unspecified: Secondary | ICD-10-CM | POA: Insufficient documentation

## 2012-07-18 DIAGNOSIS — Z7982 Long term (current) use of aspirin: Secondary | ICD-10-CM | POA: Insufficient documentation

## 2012-07-18 DIAGNOSIS — J309 Allergic rhinitis, unspecified: Secondary | ICD-10-CM | POA: Insufficient documentation

## 2012-07-18 DIAGNOSIS — Z9109 Other allergy status, other than to drugs and biological substances: Secondary | ICD-10-CM

## 2012-07-18 NOTE — ED Notes (Signed)
Pt presents with 6 week h/o bilateral eyes watering.  Pt denies any vision change, itching, nasal congestion or cough.  Pt denies any pain.

## 2012-07-18 NOTE — ED Notes (Signed)
Pt reports right eye constantly running water X6 weeks.  Pt denies pain, injury,allergies, vision changes.  Pt has not seen opthamologist but states she used to see eye dr in [place] and got drops.

## 2012-07-18 NOTE — ED Provider Notes (Signed)
History     CSN: [id number]  Arrival date & time 07/18/12  1123   First MD Initiated Contact with Patient 07/18/12 1146      No chief complaint on file.   (Consider location/radiation/quality/duration/timing/severity/associated sxs/prior treatment) HPI Comments: 67 year old female presents emergency department complaining of clear watery eyes worse in her right eye x6 weeks. She's been using over-the-counter result drops without any relief. She recently moved here from [facility] and does not have an eye doctor and time, however the last time she went to get her eyes checked about a month and a half ago she was told she had seasonal allergies. She does not take an allergy pill. Patient denies any vision changes, eye pain or itching. No foreign body sensation. Denies any other symptoms or complaints at this time.  The history is provided by the patient.    Past Medical History  Diagnosis Date  . Acid reflux   . Hypertension   . Hypercholesteremia   . Osteoporosis   . Irregular heart beat     PVC's    History reviewed. No pertinent past surgical history.  Family History  Problem Relation Age of Onset  . Breast cancer Mother   . Lung cancer Sister   . Coronary artery disease      History  Substance Use Topics  . Smoking status: Never Smoker   . Smokeless tobacco: Never Used  . Alcohol Use: No    OB History   Grav Para Term Preterm Abortions TAB SAB Ect Mult Living                  Review of Systems  Eyes: Positive for discharge. Negative for photophobia, pain, redness, itching and visual disturbance.  All other systems reviewed and are negative.    Allergies  Azithromycin and Penicillins  Home Medications   Current Outpatient Rx  Name  Route  Sig  Dispense  Refill  . alendronate (FOSAMAX) 70 MG tablet   Oral   Take 70 mg by mouth every 7 (seven) days. Take with a full glass of water on an empty stomach. Take on Thursday         . aspirin EC 81 MG  tablet   Oral   Take 81 mg by mouth daily.          . calcium-vitamin D (OSCAL WITH D) 500-200 MG-UNIT per tablet   Oral   Take 1 tablet by mouth 2 (two) times daily.          Marland Kitchen latanoprost (XALATAN) 0.005 % ophthalmic solution   Both Eyes   Place 1 drop into both eyes at bedtime.          Marland Kitchen losartan-hydrochlorothiazide (HYZAAR) 100-25 MG per tablet   Oral   Take 0.5 tablets by mouth daily.   30 tablet   3   . pantoprazole (PROTONIX) 40 MG tablet   Oral   Take 40 mg by mouth daily.          . simvastatin (ZOCOR) 40 MG tablet   Oral   Take 40 mg by mouth at bedtime.            BP 109/59  Pulse 58  Temp(Src) 97.4 F (36.3 C) (Oral)  Resp 16  SpO2 100%  Physical Exam  Nursing note and vitals reviewed. Constitutional: She is oriented to person, place, and time. She appears well-developed and well-nourished. No distress.  HENT:  Head: Normocephalic and atraumatic.  Mouth/Throat:  Oropharynx is clear and moist.  Eyes: Conjunctivae and EOM are normal. Pupils are equal, round, and reactive to light. Right eye exhibits no discharge. No foreign body present in the right eye. Left eye exhibits no discharge. No foreign body present in the left eye.  Neck: Normal range of motion. Neck supple.  Cardiovascular: Normal rate, regular rhythm and normal heart sounds.   Pulmonary/Chest: Effort normal and breath sounds normal.  Musculoskeletal: Normal range of motion. She exhibits no edema.  Lymphadenopathy:    She has no cervical adenopathy.  Neurological: She is alert and oriented to person, place, and time.  Skin: Skin is warm and dry. No rash noted. No erythema.  Psychiatric: She has a normal mood and affect. Her behavior is normal.    ED Course  Procedures (including critical care time)  Labs Reviewed - No data to display No results found.   1. Environmental allergies   2. Watery eyes, right       MDM  67 year old female with watery eyes, worse on the right  more than likely from environmental allergies. She recently moved from [place] to [facility] and had a change in weather. No visual disturbance, eye pain or itching. No active discharge on my examination. I advised her to take an allergy pill daily. Have given her followup with ophthalmology and she will followup with her PCP. Return cautions discussed. Patient states understanding of plan and is agreeable.        Trevor Mace, PA-C 07/18/12 1236

## 2012-07-25 NOTE — ED Provider Notes (Signed)
Medical screening examination/treatment/procedure(s) were performed by non-physician practitioner and as supervising physician I was immediately available for consultation/collaboration.  Derwood Kaplan, MD 07/25/12 1842

## 2012-08-10 ENCOUNTER — Encounter (HOSPITAL_COMMUNITY): Payer: Self-pay | Admitting: Nurse Practitioner

## 2012-08-10 ENCOUNTER — Emergency Department (HOSPITAL_COMMUNITY)
Admission: EM | Admit: 2012-08-10 | Discharge: 2012-08-10 | Disposition: A | Discharge status: Home / Self Care | Payer: Self-pay | Attending: Emergency Medicine | Admitting: Emergency Medicine

## 2012-08-10 DIAGNOSIS — I10 Essential (primary) hypertension: Secondary | ICD-10-CM | POA: Insufficient documentation

## 2012-08-10 DIAGNOSIS — Z79899 Other long term (current) drug therapy: Secondary | ICD-10-CM | POA: Insufficient documentation

## 2012-08-10 DIAGNOSIS — R112 Nausea with vomiting, unspecified: Secondary | ICD-10-CM | POA: Insufficient documentation

## 2012-08-10 DIAGNOSIS — R42 Dizziness and giddiness: Secondary | ICD-10-CM | POA: Insufficient documentation

## 2012-08-10 DIAGNOSIS — R51 Headache: Secondary | ICD-10-CM | POA: Insufficient documentation

## 2012-08-10 DIAGNOSIS — M810 Age-related osteoporosis without current pathological fracture: Secondary | ICD-10-CM | POA: Insufficient documentation

## 2012-08-10 DIAGNOSIS — Z8679 Personal history of other diseases of the circulatory system: Secondary | ICD-10-CM | POA: Insufficient documentation

## 2012-08-10 DIAGNOSIS — Z7982 Long term (current) use of aspirin: Secondary | ICD-10-CM | POA: Insufficient documentation

## 2012-08-10 DIAGNOSIS — E7800 Pure hypercholesterolemia, unspecified: Secondary | ICD-10-CM | POA: Insufficient documentation

## 2012-08-10 DIAGNOSIS — K219 Gastro-esophageal reflux disease without esophagitis: Secondary | ICD-10-CM | POA: Insufficient documentation

## 2012-08-10 DIAGNOSIS — R197 Diarrhea, unspecified: Secondary | ICD-10-CM | POA: Insufficient documentation

## 2012-08-10 LAB — CBC WITH DIFFERENTIAL/PLATELET
Eosinophils Absolute: 0.1 10*3/uL (ref 0.0–0.7)
HCT: 35.1 % — ABNORMAL LOW (ref 36.0–46.0)
Hemoglobin: 11.8 g/dL — ABNORMAL LOW (ref 12.0–15.0)
Lymphs Abs: 1.6 10*3/uL (ref 0.7–4.0)
MCH: 30.4 pg (ref 26.0–34.0)
Monocytes Absolute: 0.3 10*3/uL (ref 0.1–1.0)
Monocytes Relative: 7 % (ref 3–12)
Neutrophils Relative %: 49 % (ref 43–77)
RBC: 3.88 MIL/uL (ref 3.87–5.11)

## 2012-08-10 LAB — COMPREHENSIVE METABOLIC PANEL
AST: 19 U/L (ref 0–37)
Albumin: 3.9 g/dL (ref 3.5–5.2)
CO2: 31 mEq/L (ref 19–32)
Calcium: 10.1 mg/dL (ref 8.4–10.5)
Creatinine, Ser: 0.77 mg/dL (ref 0.50–1.10)
GFR calc non Af Amer: 86 mL/min — ABNORMAL LOW (ref >90)
Sodium: 141 mEq/L (ref 135–145)
Total Protein: 7.7 g/dL (ref 6.0–8.3)

## 2012-08-10 MED ORDER — ACETAMINOPHEN 500 MG PO TABS
1000.0000 mg | ORAL_TABLET | Freq: Once | ORAL | Status: AC
  Administered 2012-08-10: 1000 mg via ORAL
  Filled 2012-08-10: qty 2

## 2012-08-10 MED ORDER — KETOROLAC TROMETHAMINE 60 MG/2ML IM SOLN: 30.0000 mg | Freq: Once | INTRAMUSCULAR | Status: DC

## 2012-08-10 NOTE — ED Provider Notes (Signed)
History     CSN: [id number]  Arrival date & time 08/10/12  1342   First MD Initiated Contact with Patient 08/10/12 1454      Chief Complaint  Patient presents with  . Headache    (Consider location/radiation/quality/duration/timing/severity/associated sxs/prior treatment) Patient is a 67 y.o. female presenting with headaches. The history is provided by the patient.  Headache Pain location:  Generalized Quality:  Dull Radiates to:  Does not radiate Severity currently:  4/10 Severity at highest:  4/10 Onset quality:  Gradual Duration:  1 week Timing:  Constant Progression:  Waxing and waning Chronicity:  New Context: not activity, not exposure to bright light, not caffeine and not straining   Ineffective treatments:  None tried Associated symptoms: diarrhea, dizziness ("feel like I'm going to lose my balance", no falls), nausea and vomiting   Associated symptoms: no abdominal pain, no back pain, no blurred vision, no congestion, no cough, no ear pain, no pain, no facial pain, no fever, no focal weakness, no hearing loss, no myalgias, no near-syncope, no neck stiffness, no numbness, no paresthesias, no photophobia, no sinus pressure, no sore throat, no URI and no visual change   Diarrhea:    Diarrhea characteristics: loose, non-bloody.   Severity:  Mild   Duration:  3 days   Progression:  Resolved (last loose BM on Wednesday) Vomiting:    Quality:  Stomach contents   Severity:  Mild   Duration:  3 days   Timing:  Intermittent   Progression:  Resolved (last emesis on Wednesday)   Past Medical History  Diagnosis Date  . Acid reflux   . Hypertension   . Hypercholesteremia   . Osteoporosis   . Irregular heart beat     PVC's    History reviewed. No pertinent past surgical history.  Family History  Problem Relation Age of Onset  . Breast cancer Mother   . Lung cancer Sister   . Coronary artery disease      History  Substance Use Topics  . Smoking status: Never  Smoker   . Smokeless tobacco: Never Used  . Alcohol Use: No    OB History   Grav Para Term Preterm Abortions TAB SAB Ect Mult Living                  Review of Systems  Constitutional: Negative for fever and chills.  HENT: Negative for hearing loss, ear pain, congestion, sore throat, neck stiffness and sinus pressure.   Eyes: Negative for blurred vision, photophobia and pain.  Respiratory: Negative for cough.   Cardiovascular: Negative for near-syncope.  Gastrointestinal: Positive for nausea, vomiting and diarrhea. Negative for abdominal pain.  Musculoskeletal: Negative for myalgias and back pain.  Neurological: Positive for dizziness ("feel like I'm going to lose my balance", no falls) and headaches. Negative for focal weakness, numbness and paresthesias.  All other systems reviewed and are negative.    Allergies  Azithromycin and Penicillins  Home Medications   Current Outpatient Rx  Name  Route  Sig  Dispense  Refill  . alendronate (FOSAMAX) 70 MG tablet   Oral   Take 70 mg by mouth every 7 (seven) days. Take with a full glass of water on an empty stomach. Take on Thursday         . aspirin EC 81 MG tablet   Oral   Take 81 mg by mouth daily.          . calcium-vitamin D (OSCAL WITH D) 500-200 MG-UNIT  per tablet   Oral   Take 1 tablet by mouth 2 (two) times daily.          Marland Kitchen latanoprost (XALATAN) 0.005 % ophthalmic solution   Both Eyes   Place 1 drop into both eyes at bedtime.          Marland Kitchen losartan-hydrochlorothiazide (HYZAAR) 100-25 MG per tablet   Oral   Take 0.5 tablets by mouth daily.   30 tablet   3   . pantoprazole (PROTONIX) 40 MG tablet   Oral   Take 40 mg by mouth daily.          . simvastatin (ZOCOR) 40 MG tablet   Oral   Take 40 mg by mouth at bedtime.            BP 108/67  Pulse 57  Temp(Src) 97.9 F (36.6 C) (Oral)  Resp 18  SpO2 99%  Physical Exam  Vitals reviewed. Constitutional: She is oriented to person, place, and  time. She appears well-developed and well-nourished.  HENT:  Right Ear: External ear normal.  Left Ear: External ear normal.  Mouth/Throat: No oropharyngeal exudate.  Eyes: Conjunctivae and EOM are normal. Pupils are equal, round, and reactive to light.  Neck: Normal range of motion. Neck supple.  Cardiovascular: Normal rate, regular rhythm, normal heart sounds and intact distal pulses.  Exam reveals no gallop and no friction rub.   No murmur heard. Pulmonary/Chest: Effort normal and breath sounds normal.  Abdominal: Soft. Bowel sounds are normal. She exhibits no distension. There is no tenderness.  Musculoskeletal: Normal range of motion. She exhibits no edema.  Neurological: She is alert and oriented to person, place, and time. She has normal strength. No cranial nerve deficit or sensory deficit. She displays a negative Romberg sign. Coordination and gait normal.  Gait normal F2N, H2S, RAM all normal HINTS testing normal  Skin: Skin is warm and dry. No rash noted.  Psychiatric: She has a normal mood and affect.    ED Course  Procedures (including critical care time)  Labs Reviewed  COMPREHENSIVE METABOLIC PANEL - Abnormal; Notable for the following:    Potassium 3.1 (*)    GFR calc non Af Amer 86 (*)    All other components within normal limits  CBC WITH DIFFERENTIAL - Abnormal; Notable for the following:    WBC 3.8 (*)    Hemoglobin 11.8 (*)    HCT 35.1 (*)    All other components within normal limits  GLUCOSE, CAPILLARY   No results found.   1. Headache   2. Dizziness       MDM   12 y F with PMH of HTN here for intermittent, waxing and waning HA x 1 week.  She had some loose stools and diarrhea earlier in the week, but none since Wednesday.  AFVSS, NAD, smiling on exam.  Neuro exam WNLs.  Normal gait.  Normal cerebellar testing.  Negative HINTS.  No alarm features.  No meningismus.  No features c/w glaucoma or TA.  Pt has f/u with PCP in 5 days on Friday. Labs from  triage WNLs.  It is felt that symptomatic tx is a reasonable approach at this point since she has not tried anything and is so well appearing.  Will forego further testing at this point and focus on symptom relief.  4:04 PM Pt reports a lot of stress recently in her life, namely with the passing of her daughter in September.  Her symptoms are likely multifactorial  in nature, but seriously doubt emergent condition at this point.  Strong return precautions reviewed.  It is felt the pt is stable for d/c with close PCP f/u.  All questions answered and patient expressed understanding.  Disposition: Discharge  Condition: Good  Follow-up Information   Follow up with MATTHEWS,CODY, DO. (Follow up on Friday as scheduled.)    Contact information:   [address] [place] [address] [phone]       Pt seen in conjunction with my attending, Dr. Linwood Dibbles.   Oleh Genin, MD PGY-II [facility] Emergency Medicine Resident       Oleh Genin, MD 08/10/12 [phone]

## 2012-08-10 NOTE — ED Notes (Signed)
C/o intemittent headaches, dizziness, n/v since last week. Denies any head injury, vision changes. A&Ox4, resp e/u

## 2012-08-10 NOTE — ED Provider Notes (Signed)
Pt with mild headache.  No focal neurologic deficits.  She mentions having increased stress.  Her daughter died recently.  She is caring of her grandchildren.  Pt was reassured.  Follow up with pcp.  Monitor for weakness, fever, neurologic  Symptoms.  I saw and evaluated the patient, reviewed the resident's note and I agree with the findings and plan.   Celene Kras, MD 08/10/12 [phone]

## 2012-08-15 ENCOUNTER — Ambulatory Visit (INDEPENDENT_AMBULATORY_CARE_PROVIDER_SITE_OTHER): Payer: Self-pay | Admitting: Family Medicine

## 2012-08-15 ENCOUNTER — Encounter: Payer: Self-pay | Admitting: Family Medicine

## 2012-08-15 VITALS — BP 134/80 | HR 59 | Ht 65.0 in | Wt 143.1 lb

## 2012-08-15 DIAGNOSIS — R42 Dizziness and giddiness: Secondary | ICD-10-CM

## 2012-08-15 DIAGNOSIS — I10 Essential (primary) hypertension: Secondary | ICD-10-CM

## 2012-08-15 MED ORDER — FLUTICASONE PROPIONATE 50 MCG/ACT NA SUSP: 2.0000 | Freq: Every day | NASAL | Status: DC

## 2012-08-15 NOTE — Assessment & Plan Note (Signed)
No neurological deficits, possibly related to eustachian tube dysfunction given fluid behind TM and nasal congestion.  Will have her try flonase to see if this is helpful.  Follow up in 2-3 weeks.

## 2012-08-15 NOTE — Assessment & Plan Note (Signed)
BP well controlled today, no changes to medications.

## 2012-08-15 NOTE — Progress Notes (Signed)
  Subjective:    Patient ID: Kristy Cox, female    DOB: Jan 26, 1946, 67 y.o.   MRN: [id number]  Hypertension    1. CHRONIC HYPERTENSION  Disease Monitoring  Blood pressure range: No self monitoring at home  Chest pain: no   Dyspnea: no   Claudication: no   Medication compliance: yes  Medication Side Effects  Lightheadedness: no, but has had some dizziness recently    Urinary frequency: no   Edema: no   Preventitive Healthcare:  Exercise: yes, walks frequently   Diet Pattern: Generally healthy, no added salt and tries to avoid processed foods.     2. Dizziness: Seen in ED last week for headache and dizziness.  Described at feeling like the room is spinning or being on the ocean.  No further headache since last week.  Does endorse some chronic sinus congestion.  Some dizziness with standing, but no difficulty ambulating.  Denies nausea, vision changes, weakness, numbness.   Review of Systems Per HPI    Objective:   Physical Exam  Constitutional: She is oriented to person, place, and time. She appears well-nourished. No distress.  HENT:  Head: Normocephalic and atraumatic.  L ear with small amount of fluid behind TM.  R TM normal.   Eyes: EOM are normal. Pupils are equal, round, and reactive to light.  Neck: Neck supple.  Cardiovascular: Normal rate, regular rhythm and normal heart sounds.   Pulmonary/Chest: Effort normal and breath sounds normal. No respiratory distress.  Musculoskeletal: She exhibits no edema.  Neurological: She is alert and oriented to person, place, and time. No cranial nerve deficit. She displays a negative Romberg sign. Coordination and gait normal.          Assessment & Plan:

## 2012-08-15 NOTE — Patient Instructions (Addendum)
Thank you for coming in today, it was good to see you Your blood pressure looks ok today Follow up with me in 2-3weeks to see if your dizziness has improved.

## 2012-09-03 ENCOUNTER — Encounter: Payer: Self-pay | Admitting: Sports Medicine

## 2012-09-03 ENCOUNTER — Ambulatory Visit (INDEPENDENT_AMBULATORY_CARE_PROVIDER_SITE_OTHER): Payer: Self-pay | Admitting: Sports Medicine

## 2012-09-03 VITALS — BP 117/74 | Ht 64.0 in | Wt 132.0 lb

## 2012-09-03 DIAGNOSIS — M7750 Other enthesopathy of unspecified foot: Secondary | ICD-10-CM

## 2012-09-03 DIAGNOSIS — M7742 Metatarsalgia, left foot: Secondary | ICD-10-CM

## 2012-09-03 NOTE — Assessment & Plan Note (Signed)
Orthotics made today. Patient was given metatarsal pads and first ray post as well as a heel lift on the left side. Patient will try these in increase slowly the amount of time daily over the course of the next 2 weeks. Patient will followup within the next 2 weeks for any adjustments if necessary. Otherwise will have patient come back as needed.

## 2012-09-03 NOTE — Progress Notes (Signed)
Patient is here for followup of her left foot pain. Patient was diagnosed with metatarsalgia and was put into temporary sports insoles with metatarsal pads. Patient states that her foot has been feeling much better but she is starting to have some sensitive areas on the plantar aspect of the left foot under the great toe. Patient feels that this is going to start causing more discomfort and pain again. Patient is wondering what else she can do. Patient is here for evaluation as well as potential custom orthotics. Denies any new symptoms otherwise such as numbness or radiation the pain. Patient is still able to do all her activities of daily living.  Past medical history, social, surgical and family history all reviewed.   Physical exam Blood pressure 117/74, height 5\' 4"  (1.626 m), weight 132 lb (59.875 kg). GENERAL: Adult female. In no discomfort; no respiratory distress.  FOOT EXAM:: Bilateral transverse arch collapse, L worse than R. Longitudinal arch preserved, Calcaneus is neutral, mild to moderate toe splaying between first and second. Morton's callus present bilaterally. Antalgic gait due to leg length of 1/4 inch shorter on left. Tenderness to palpation over the second MTP joints. No pain with squeeze, no pain with palpation over third interspace. No neuroma appreciated. Neurovascularly intact.  Hallux rigidis bilaterally left greater than right    Patient was fitted for a : standard, cushioned, semi-rigid orthotic. The orthotic was heated and afterward the patient stood on the orthotic blank positioned on the orthotic stand. The patient was positioned in subtalar neutral position and 10 degrees of ankle dorsiflexion in a weight bearing stance. After completion of molding, a stable base was applied to the orthotic blank. The blank was ground to a stable position for weight bearing. Size: 9 Base: Blue EVA Additional Posting and Padding: 1st ray post, metatarsal cookies bilateral and heel  lift 3/16'' on left for leg length.  The patient ambulated these, and they were very comfortable.  I spent 40 minutes with this patient, greater than 50% was face-to-face time counseling regarding the below diagnosis.

## 2012-10-24 ENCOUNTER — Other Ambulatory Visit: Payer: Self-pay

## 2012-10-28 ENCOUNTER — Other Ambulatory Visit: Payer: Self-pay | Admitting: Family Medicine

## 2012-10-28 DIAGNOSIS — Z1231 Encounter for screening mammogram for malignant neoplasm of breast: Secondary | ICD-10-CM

## 2012-10-31 ENCOUNTER — Ambulatory Visit: Payer: Self-pay | Admitting: Family Medicine

## 2012-10-31 ENCOUNTER — Telehealth: Payer: Self-pay | Admitting: *Deleted

## 2012-10-31 ENCOUNTER — Ambulatory Visit (INDEPENDENT_AMBULATORY_CARE_PROVIDER_SITE_OTHER): Payer: Self-pay | Admitting: Emergency Medicine

## 2012-10-31 ENCOUNTER — Encounter: Payer: Self-pay | Admitting: Emergency Medicine

## 2012-10-31 VITALS — BP 130/78 | HR 98 | Ht 65.0 in | Wt 148.5 lb

## 2012-10-31 DIAGNOSIS — K219 Gastro-esophageal reflux disease without esophagitis: Secondary | ICD-10-CM

## 2012-10-31 DIAGNOSIS — R1013 Epigastric pain: Secondary | ICD-10-CM

## 2012-10-31 LAB — CBC
Hemoglobin: 11.7 g/dL — ABNORMAL LOW (ref 12.0–15.0)
MCH: 30.3 pg (ref 26.0–34.0)
MCHC: 33.3 g/dL (ref 30.0–36.0)
MCV: 90.9 fL (ref 78.0–100.0)

## 2012-10-31 LAB — POCT H PYLORI SCREEN: H Pylori Screen, POC: POSITIVE

## 2012-10-31 MED ORDER — TETRACYCLINE HCL 500 MG PO CAPS: 500.0000 mg | ORAL_CAPSULE | Freq: Four times a day (QID) | ORAL | Status: DC

## 2012-10-31 MED ORDER — SUCRALFATE 1 GM/10ML PO SUSP: 1.0000 g | Freq: Three times a day (TID) | ORAL | Status: DC

## 2012-10-31 MED ORDER — BISMUTH SUBSALICYLATE 525 MG/15ML PO SUSP: 525.0000 mg | Freq: Four times a day (QID) | ORAL | Status: DC

## 2012-10-31 MED ORDER — GI COCKTAIL ~~LOC~~
30.0000 mL | Freq: Once | ORAL | Status: AC
  Administered 2012-10-31: 30 mL via ORAL

## 2012-10-31 MED ORDER — METRONIDAZOLE 250 MG PO TABS: 250.0000 mg | ORAL_TABLET | Freq: Four times a day (QID) | ORAL | Status: DC

## 2012-10-31 MED ORDER — PANTOPRAZOLE SODIUM 40 MG PO TBEC: 40.0000 mg | DELAYED_RELEASE_TABLET | Freq: Two times a day (BID) | ORAL | Status: DC

## 2012-10-31 NOTE — Assessment & Plan Note (Addendum)
Concerning for developing PUD. Hold fosamax and NSAIDs for now. Will check CBC and h pylori. Would like to refer to GI, but does not have insurance. Will have patient follow up in 2 weeks.  Edit: H pylori is positive.  Will treat with increased protonix, bismuth, tetracycline and flagyl.  Will call patient with this information.

## 2012-10-31 NOTE — Patient Instructions (Signed)
I am worried that you have a stomach ulcer. Please continue the pantoprazole daily.  Start carafate 3 times daily with meals. I have put in a referral for you to see a stomach doctor. I am also checking labs for a possible stomach infection.  If it is positive, I will call you.  If that pain gets worse, you start vomiting, see blood in your stool or vomit, or have dark tarry stools, please come back right away. Otherwise, follow up in 4 weeks.

## 2012-10-31 NOTE — Telephone Encounter (Signed)
Pt has no prescription coverage - copay for tetracycline is $550 - is there an alternative that could be called in ? Wyatt Haste, RN-BSN

## 2012-10-31 NOTE — Progress Notes (Signed)
  Subjective:    Patient ID: Kristy Cox, female    DOB: 02/18/1946, 67 y.o.   MRN: [id number]  HPI Kristy Cox is here for a SDA for pain between breasts.  She states that this has been going on for about 2 months, but worse in the last 1 week.  Pain is located under the inferior sternum.  Unable to describe it other than not burning or sharp.  It is present constantly, no association with food, position or exertion.  Associated with nausea, especially after eating.  Did have one episode of emesis at the start of this, but none in the last month.  Reports loose stools 3-4 times a day.  Denies any blood or dark tarry stools.  She is taking protonix 40mg  daily for heartburn.  She states that this is different from her heartburn.  No dizziness.  I have reviewed and updated the following as appropriate: allergies and current medications SHx: never smoker  Review of Systems See HPI    Objective:   Physical Exam BP 130/78  Pulse 98  Ht 5\' 5"  (1.651 m)  Wt 148 lb 8 oz (67.359 kg)  BMI 24.71 kg/m2 Gen: alert, cooperative, NAD, does sit a little stiffly HEENT: AT/[place], sclera white, MMM Neck: supple CV: RRR, no murmurs Pulm: CTAB, no wheezes or rales Abd: +BS, soft, ND, tender in epigastric without rebound or guarding  Minimal improvement after GI cocktail. POC hemoccult negative.     Assessment & Plan:

## 2012-11-01 ENCOUNTER — Encounter: Payer: Self-pay | Admitting: Emergency Medicine

## 2012-11-03 MED ORDER — CLARITHROMYCIN 500 MG PO TABS: 500.0000 mg | ORAL_TABLET | Freq: Two times a day (BID) | ORAL | Status: DC

## 2012-11-03 NOTE — Telephone Encounter (Signed)
Due to cost concerns, will change tetracycline to clarithromycin 500mg  BID x14 days.  Message sent to patient with this information.

## 2012-11-03 NOTE — Telephone Encounter (Signed)
Medication changed.  Please see Patient email from 11/01/12.

## 2012-11-05 ENCOUNTER — Ambulatory Visit (HOSPITAL_COMMUNITY)
Admission: RE | Admit: 2012-11-05 | Discharge: 2012-11-05 | Disposition: A | Discharge status: Home / Self Care | Payer: Self-pay | Source: Ambulatory Visit | Attending: Internal Medicine | Admitting: Internal Medicine

## 2012-11-05 DIAGNOSIS — Z1231 Encounter for screening mammogram for malignant neoplasm of breast: Secondary | ICD-10-CM | POA: Insufficient documentation

## 2012-11-12 ENCOUNTER — Ambulatory Visit (HOSPITAL_COMMUNITY): Payer: Self-pay

## 2012-11-19 ENCOUNTER — Other Ambulatory Visit: Payer: Self-pay

## 2013-01-01 ENCOUNTER — Ambulatory Visit (INDEPENDENT_AMBULATORY_CARE_PROVIDER_SITE_OTHER): Payer: Self-pay | Admitting: Sports Medicine

## 2013-01-01 ENCOUNTER — Encounter: Payer: Self-pay | Admitting: Sports Medicine

## 2013-01-01 VITALS — BP 120/71 | HR 55 | Ht 65.0 in | Wt 148.0 lb

## 2013-01-01 DIAGNOSIS — R202 Paresthesia of skin: Secondary | ICD-10-CM

## 2013-01-01 DIAGNOSIS — R209 Unspecified disturbances of skin sensation: Secondary | ICD-10-CM

## 2013-01-02 NOTE — Progress Notes (Signed)
  Subjective:    Patient ID: Kristy Cox, female    DOB: 03/28/1946, 67 y.o.   MRN: [id number]  HPI Patient comes in today complaining 2 months of an intermittent "cold sensation" along the lateral left lower leg. She states that 2-3 times a day she'll experience a sensation along the scan of the lateral lower leg that feels like a "breeze is blowing across it". Symptoms last only a few seconds and is not associated with any numbness or tingling. No weakness in the leg or foot. No pain in her knee. She is doing very well with her orthotics.   Review of Systems     Objective:   Physical Exam Well-developed, well-nourished. No acute distress  Left knee: Full range of motion. No effusion. There is no tenderness to palpation or percussion at the fibular head and a negative Tinel's here as well.  Left lower leg: No atrophy. Strength is 5/5 at the knee and ankle. Sensation is intact to light touch. Walking without a limp.       Assessment & Plan:  Intermittent mild left lower leg paresthesias  I'm unsure of the etiology of this patient's symptoms but it does sound like it is neuropathic in nature. Since it is very fleeting and not associated with any weakness or pain I recommended that we give this some time before beginning an aggressive workup. I have instructed the patient to be cognizant of sitting with her left leg crossed over her right as this may be signs of an early peroneal neuropathy. If her symptoms worsen or become associated with numbness or weakness she is to return to the office for reevaluation and further workup. Otherwise, followup PRN.

## 2013-01-06 ENCOUNTER — Encounter: Payer: Self-pay | Admitting: Family Medicine

## 2013-01-06 ENCOUNTER — Ambulatory Visit (INDEPENDENT_AMBULATORY_CARE_PROVIDER_SITE_OTHER): Payer: Self-pay | Admitting: Family Medicine

## 2013-01-06 VITALS — BP 134/76 | HR 55 | Temp 98.0°F | Ht 64.0 in | Wt 144.7 lb

## 2013-01-06 DIAGNOSIS — K219 Gastro-esophageal reflux disease without esophagitis: Secondary | ICD-10-CM

## 2013-01-06 DIAGNOSIS — Z23 Encounter for immunization: Secondary | ICD-10-CM

## 2013-01-06 DIAGNOSIS — R1013 Epigastric pain: Secondary | ICD-10-CM

## 2013-01-06 MED ORDER — BISMUTH SUBSALICYLATE 525 MG/15ML PO SUSP: 525.0000 mg | Freq: Four times a day (QID) | ORAL | Status: DC

## 2013-01-06 NOTE — Assessment & Plan Note (Addendum)
Patient symptoms remain uncontrolled despite daily use of Protonix. She continues to use Fosamax. She did not complete course of clarithromycin, Flagyl, bismuth for treatment of H. pylori due to cost of the medications. -Patient is amenable to restarting bismuth 4 times daily in addition to Protonix -Patient would likely benefit from GI consult and endoscopy however she does not currently have insurance, patient will meet with office staff to see if she qualifies for orange card -Patient was counseled to stop Fosamax, she is to discontinue aspirin and to avoid NSAIDs if possible.

## 2013-01-06 NOTE — Progress Notes (Signed)
  Subjective:    Patient ID: Kristy Cox, female    DOB: Nov 06, 1945, 67 y.o.   MRN: [id number]  HPI 67 year old female presents for followup of acid reflux, patient has been down of reflux for close to 10 years, she is currently on Protonix 40 mg by mouth twice a day, she has previously been given quadruple therapy for treatment of H. pylori however she was unable to afford the antibiotics and did not complete the treatment, she is pretty symptoms with burning epigastric pain and coughing. Denies emesis, normal bowel movements, no blood in her stool   Review of Systems  Constitutional: Negative for fever, chills and fatigue.  Respiratory: Positive for cough.   Cardiovascular: Negative for chest pain.  Gastrointestinal: Negative for nausea, diarrhea and constipation.       Objective:   Physical Exam Vitals: Reviewed General: Pleasant female, no acute distress HEENT: Normocephalic, extraocular movements are intact, pupils equal round reactive to light, moist mucous membranes, uvula midline, normal pharynx without erythema or exudate, neck was supple, no anterior posterior cervical lymphadenopathy was appreciated Cardiac: Regular rate and rhythm, S1-S2 present, no murmurs, no heaves or thrills Respiratory: Clear to auscultation bilaterally, normal effort Abdomen: Epigastric abdominal tenderness, bowel sounds present, no rebound or guarding       Assessment & Plan:  Please see problem specific assessment and plan.

## 2013-01-06 NOTE — Patient Instructions (Addendum)
Start taking Bismuth 4 times daily in addition to the Protonix. Stop Fosamax as this may be contributing to your symptoms.   Will need to check into getting orange care so that we can refer you to GI.

## 2013-01-13 ENCOUNTER — Ambulatory Visit: Payer: Self-pay

## 2013-01-20 ENCOUNTER — Ambulatory Visit: Payer: Self-pay

## 2013-02-03 ENCOUNTER — Encounter: Payer: Self-pay | Admitting: Emergency Medicine

## 2013-02-04 ENCOUNTER — Other Ambulatory Visit: Payer: Self-pay | Admitting: Emergency Medicine

## 2013-02-04 DIAGNOSIS — R1013 Epigastric pain: Secondary | ICD-10-CM

## 2013-02-04 MED ORDER — PANTOPRAZOLE SODIUM 40 MG PO TBEC: 40.0000 mg | DELAYED_RELEASE_TABLET | Freq: Two times a day (BID) | ORAL | Status: DC

## 2013-02-04 MED ORDER — LOSARTAN POTASSIUM-HCTZ 100-25 MG PO TABS: 1.0000 | ORAL_TABLET | Freq: Every day | ORAL | Status: DC

## 2013-02-04 MED ORDER — SIMVASTATIN 40 MG PO TABS: 40.0000 mg | ORAL_TABLET | Freq: Every day | ORAL | Status: DC

## 2013-02-04 MED ORDER — NIFEDIPINE ER OSMOTIC RELEASE 30 MG PO TB24: 30.0000 mg | ORAL_TABLET | Freq: Every day | ORAL | Status: DC

## 2013-02-19 ENCOUNTER — Encounter: Payer: Self-pay | Admitting: Emergency Medicine

## 2013-02-19 ENCOUNTER — Ambulatory Visit (INDEPENDENT_AMBULATORY_CARE_PROVIDER_SITE_OTHER): Payer: No Typology Code available for payment source | Admitting: Emergency Medicine

## 2013-02-19 VITALS — BP 115/70 | HR 56 | Temp 98.1°F | Wt 142.0 lb

## 2013-02-19 DIAGNOSIS — R1013 Epigastric pain: Secondary | ICD-10-CM

## 2013-02-19 DIAGNOSIS — Z1211 Encounter for screening for malignant neoplasm of colon: Secondary | ICD-10-CM

## 2013-02-19 DIAGNOSIS — I10 Essential (primary) hypertension: Secondary | ICD-10-CM

## 2013-02-19 MED ORDER — AMLODIPINE BESYLATE 5 MG PO TABS: 5.0000 mg | ORAL_TABLET | Freq: Every day | ORAL | Status: DC

## 2013-02-19 MED ORDER — SIMVASTATIN 40 MG PO TABS: 40.0000 mg | ORAL_TABLET | Freq: Every day | ORAL | Status: DC

## 2013-02-19 MED ORDER — SUCRALFATE 1 GM/10ML PO SUSP: 1.0000 g | Freq: Three times a day (TID) | ORAL | Status: DC

## 2013-02-19 MED ORDER — LATANOPROST 0.005 % OP SOLN: 1.0000 [drp] | Freq: Every day | OPHTHALMIC | Status: DC

## 2013-02-19 NOTE — Assessment & Plan Note (Signed)
Nifedipine changed to amlodipine 5mg  daily due to cost. Follow up 2 weeks after starting amlodipine.

## 2013-02-19 NOTE — Assessment & Plan Note (Signed)
Not improved. Will change bismuth to sucralfate. Continue protonix. Would like to refer to GI, but has orange card.  Numbers for [facility] and [facility] provided. Will check FOBT x3 at home.

## 2013-02-19 NOTE — Patient Instructions (Signed)
It was nice to see you!  Stop the Bismuth and start Sucralfate instead to help with your stomach.  Finish the nifedipine that you have.  When you run out, get amlodipine instead of nifedipine.  Please call [facility] at [phone] to see how much it would cost to see them. You can also try [facility] at [phone].  Please send in the stool cards to screen for colon cancer.  Follow up 2 weeks after you start taking the amlodipine.

## 2013-02-19 NOTE — Progress Notes (Signed)
  Subjective:    Patient ID: Kristy Cox, female    DOB: Oct 19, 1945, 67 y.o.   MRN: [id number]  HPI Shenika Quint is here for f/u abdominal pain.  She reports that she continues to have epigastric abdominal pain.  She states it is fairly constant and alternately feels "like a weight" or "tense."  She is able to eat and drink okay with only occasional nausea.  No vomiting or diarrhea.  Nothing makes the pain better or worse.  She is taking protonix daily and Bismuth QID, which does not seem to be helping.  She did test positive for H. Pylori, but is unable to afford the medication for treatment.  I have reviewed and updated the following as appropriate: allergies and current medications SHx: non smoker  Review of Systems See HPI    Objective:   Physical Exam BP 115/70  Pulse 56  Temp(Src) 98.1 F (36.7 C) (Oral)  Wt 142 lb (64.411 kg) Gen: alert, cooperative, NAD HEENT: AT/[place], sclera white, MMM Neck: supple Abd: +BS, soft, ND, mild tenderness in epigastric and LUQ; no rebound or guarding      Assessment & Plan:

## 2013-02-26 ENCOUNTER — Encounter: Payer: Self-pay | Admitting: Emergency Medicine

## 2013-02-27 ENCOUNTER — Other Ambulatory Visit: Payer: Self-pay | Admitting: Emergency Medicine

## 2013-02-27 LAB — POC HEMOCCULT BLD/STL (HOME/3-CARD/SCREEN)
Card #2 Fecal Occult Blod, POC: NEGATIVE
Card #3 Fecal Occult Blood, POC: NEGATIVE

## 2013-02-27 MED ORDER — NIFEDIPINE ER OSMOTIC RELEASE 30 MG PO TB24: 30.0000 mg | ORAL_TABLET | Freq: Every day | ORAL | Status: DC

## 2013-02-27 NOTE — Addendum Note (Signed)
Addended by: [place], Jasiel Apachito on: 02/27/2013 04:39 PM   Modules accepted: Orders

## 2013-03-04 ENCOUNTER — Telehealth: Payer: Self-pay | Admitting: Emergency Medicine

## 2013-03-04 NOTE — Telephone Encounter (Signed)
Called patient and told her she will need an office visit to discuss opthalmology referral and she needed to follow up on her hypertension. Patient wants to seen an eye specialist because she wants to be screened for glaucoma because of a family history. I have scheduled her an appointment with Dr Piedad Climes on 03/18/2013.Kenta Laster, Rodena Medin

## 2013-03-04 NOTE — Telephone Encounter (Signed)
Ms. Kristy Cox need an eye specialist for a little amt per pat of glaucoma..  Want to have this checked out asap.  Dennie Bible is with [facility](Orange card)recipient.

## 2013-03-15 ENCOUNTER — Encounter: Payer: Self-pay | Admitting: Emergency Medicine

## 2013-03-18 ENCOUNTER — Other Ambulatory Visit: Payer: Self-pay | Admitting: Emergency Medicine

## 2013-03-18 ENCOUNTER — Ambulatory Visit: Payer: No Typology Code available for payment source | Admitting: Emergency Medicine

## 2013-03-18 DIAGNOSIS — Z135 Encounter for screening for eye and ear disorders: Secondary | ICD-10-CM

## 2013-04-23 ENCOUNTER — Encounter: Payer: Self-pay | Admitting: Emergency Medicine

## 2013-07-29 ENCOUNTER — Encounter: Payer: Self-pay | Admitting: Emergency Medicine

## 2013-07-31 ENCOUNTER — Other Ambulatory Visit: Payer: Self-pay | Admitting: Emergency Medicine

## 2013-07-31 DIAGNOSIS — R1013 Epigastric pain: Secondary | ICD-10-CM

## 2013-07-31 MED ORDER — NIFEDIPINE ER OSMOTIC RELEASE 30 MG PO TB24: 30.0000 mg | ORAL_TABLET | Freq: Every day | ORAL | Status: DC

## 2013-07-31 MED ORDER — PANTOPRAZOLE SODIUM 40 MG PO TBEC: 40.0000 mg | DELAYED_RELEASE_TABLET | Freq: Two times a day (BID) | ORAL | Status: DC

## 2013-07-31 MED ORDER — SIMVASTATIN 40 MG PO TABS: 40.0000 mg | ORAL_TABLET | Freq: Every day | ORAL | Status: DC

## 2013-07-31 MED ORDER — LOSARTAN POTASSIUM-HCTZ 100-25 MG PO TABS: 1.0000 | ORAL_TABLET | Freq: Every day | ORAL | Status: DC

## 2013-08-17 ENCOUNTER — Ambulatory Visit: Payer: Self-pay

## 2013-08-18 ENCOUNTER — Ambulatory Visit: Payer: No Typology Code available for payment source

## 2013-09-04 ENCOUNTER — Telehealth: Payer: Self-pay | Admitting: Emergency Medicine

## 2013-09-04 NOTE — Telephone Encounter (Signed)
Explained to patient that the dental clinic only sees urgent referrals, patient expressed understanding.

## 2013-09-04 NOTE — Telephone Encounter (Signed)
Requesting dental appt for checkup and cleaning.  Call patient when referral sent

## 2013-10-12 ENCOUNTER — Other Ambulatory Visit: Payer: Self-pay | Admitting: Emergency Medicine

## 2013-10-19 ENCOUNTER — Ambulatory Visit (INDEPENDENT_AMBULATORY_CARE_PROVIDER_SITE_OTHER): Payer: No Typology Code available for payment source | Admitting: Family Medicine

## 2013-10-19 VITALS — BP 130/79 | HR 80 | Temp 98.0°F | Wt 138.8 lb

## 2013-10-19 DIAGNOSIS — R112 Nausea with vomiting, unspecified: Secondary | ICD-10-CM | POA: Insufficient documentation

## 2013-10-19 DIAGNOSIS — I10 Essential (primary) hypertension: Secondary | ICD-10-CM

## 2013-10-19 LAB — COMPREHENSIVE METABOLIC PANEL
ALT: 12 U/L (ref 0–35)
AST: 23 U/L (ref 0–37)
Albumin: 4.3 g/dL (ref 3.5–5.2)
Alkaline Phosphatase: 45 U/L (ref 39–117)
BUN: 18 mg/dL (ref 6–23)
CALCIUM: 9.1 mg/dL (ref 8.4–10.5)
CHLORIDE: 104 meq/L (ref 96–112)
CO2: 31 meq/L (ref 19–32)
CREATININE: 0.76 mg/dL (ref 0.50–1.10)
GLUCOSE: 89 mg/dL (ref 70–99)
Potassium: 3.4 mEq/L — ABNORMAL LOW (ref 3.5–5.3)
Sodium: 142 mEq/L (ref 135–145)
Total Bilirubin: 0.3 mg/dL (ref 0.2–1.2)
Total Protein: 6.8 g/dL (ref 6.0–8.3)

## 2013-10-19 LAB — CBC WITH DIFFERENTIAL/PLATELET
BASOS ABS: 0 10*3/uL (ref 0.0–0.1)
Basophils Relative: 0 % (ref 0–1)
Eosinophils Absolute: 0.1 10*3/uL (ref 0.0–0.7)
Eosinophils Relative: 2 % (ref 0–5)
HEMATOCRIT: 32.4 % — AB (ref 36.0–46.0)
HEMOGLOBIN: 11 g/dL — AB (ref 12.0–15.0)
LYMPHS ABS: 0.9 10*3/uL (ref 0.7–4.0)
LYMPHS PCT: 31 % (ref 12–46)
MCH: 30.8 pg (ref 26.0–34.0)
MCHC: 34 g/dL (ref 30.0–36.0)
MCV: 90.8 fL (ref 78.0–100.0)
MONO ABS: 0.3 10*3/uL (ref 0.1–1.0)
MONOS PCT: 9 % (ref 3–12)
NEUTROS ABS: 1.7 10*3/uL (ref 1.7–7.7)
Neutrophils Relative %: 58 % (ref 43–77)
Platelets: 201 10*3/uL (ref 150–400)
RBC: 3.57 MIL/uL — AB (ref 3.87–5.11)
RDW: 14.2 % (ref 11.5–15.5)
WBC: 3 10*3/uL — AB (ref 4.0–10.5)

## 2013-10-19 LAB — TSH: TSH: 0.662 u[IU]/mL (ref 0.350–4.500)

## 2013-10-19 MED ORDER — LOSARTAN POTASSIUM-HCTZ 100-25 MG PO TABS: 1.0000 | ORAL_TABLET | Freq: Every day | ORAL | Status: DC

## 2013-10-19 NOTE — Progress Notes (Signed)
Patient ID: Kristy Cox, female   DOB: 1946/02/24, 68 y.o.   MRN: [id number]  Kevin FentonSamuel Bradshaw, MD Phone: [phone]  Subjective:  Chief complaint-noted  Pt Here for hypertension and nausea  Hypertension Taking her medications including losartan, hydrochlorothiazide, nifedipine every day Does not check her blood pressure at home No chest pain, dyspnea, palpitations, lower extremity edema  Nausea Notes that she's had intermittent nausea practically in a daily basis for the last 2 months. About a month ago she had one week where she vomited 3 times. She describes the vomit as nonbloody and nonbilious containing only food content. She is taken small amount of salt on her tongue for the nausea which is helped some. She denies any overt abdominal pain She had an EGD and colonoscopy at [facility] in February which she states did not show anything that H. Pylori, he was treated for H. pylori She continues to taper current every day  ROS-  No fever, chills, sweats No abdominal pain No dyspnea No chest pain No edema   Past Medical History Patient Active Problem List   Diagnosis Date Noted  . Nausea with vomiting 10/19/2013  . Epigastric pain 10/31/2012  . Metatarsalgia of left foot 12/26/2011  . Foot pain 12/25/2011  . Foot pain, left 12/19/2011  . Dizziness 09/09/2011  . HTN (hypertension) 04/18/2011  . HLD (hyperlipidemia) 04/18/2011  . Osteoporosis 04/18/2011  . PVC's (premature ventricular contractions) 04/18/2011  . Acid reflux 04/18/2011    Medications- reviewed and updated Current Outpatient Prescriptions  Medication Sig Dispense Refill  . alendronate (FOSAMAX) 70 MG tablet Take 70 mg by mouth every 7 (seven) days. Take with a full glass of water on an empty stomach. Take on Thursday      . aspirin EC 81 MG tablet Take 81 mg by mouth 3 (three) times a week. On Monday, Wednesday and Friday      . calcium-vitamin D (OSCAL WITH D) 500-200 MG-UNIT per tablet Take 1  tablet by mouth 2 (two) times daily.       . fluticasone (FLONASE) 50 MCG/ACT nasal spray Place 2 sprays into the nose daily.  16 g  2  . latanoprost (XALATAN) 0.005 % ophthalmic solution Place 1 drop into both eyes at bedtime.  2.5 mL  12  . losartan-hydrochlorothiazide (HYZAAR) 100-25 MG per tablet Take 1 tablet by mouth daily.  30 tablet  11  . naproxen sodium (ANAPROX) 220 MG tablet Take 220 mg by mouth daily as needed (for pain).      Marland Kitchen. NIFEdipine (PROCARDIA XL) 30 MG 24 hr tablet Take 1 tablet (30 mg total) by mouth daily.  90 tablet  3  . pantoprazole (PROTONIX) 40 MG tablet Take 1 tablet (40 mg total) by mouth 2 (two) times daily.  60 tablet  3  . simvastatin (ZOCOR) 40 MG tablet Take 1 tablet (40 mg total) by mouth at bedtime.  30 tablet  6  . sucralfate (CARAFATE) 1 GM/10ML suspension Take 10 mLs (1 g total) by mouth 3 (three) times daily with meals.  420 mL  3   No current facility-administered medications for this visit.    Objective: BP 130/79  Pulse 80  Temp(Src) 98 F (36.7 C) (Oral)  Wt 138 lb 12.8 oz (62.959 kg) Gen: NAD, alert, cooperative with exam HEENT: NCAT CV: RRR, good S1/S2, no murmur Resp: CTABL, no wheezes, non-labored Abd: Soft, diffuse mild to moderate tenderness which is greatest in the left upper and left lower quadrant, no  rebound Ext: No edema, warm Neuro: Alert and oriented, No gross deficits   Assessment/Plan:  HTN (hypertension) Well-controlled with no red flags Patient continues to take nifedipine, never changed amlodipine Continue losartan, HCTZ, nifedipine Followup 6 month Labs today including CMP, CBC, TSH, also helping to evaluate for abdominal pain and nausea  Nausea with vomiting EGD at [facility] in February- normal esophagus, chronic gastritis, multiple biopsies showing H. Pylori which was treated. Colonoscopy normal without diverticula Her abdominal exam has diffuse tenderness greatest in left lower quadrant initially I was  concerned for diverticulosis, after reviewing [facility] chart in care everywhere in more detail I see that diverticulitis is unlikely. However I don't feel that this abdominal pain is easily explained by another process CT abdomen with still be useful. Gastroparesis was mentioned once which would explain her nausea, however this does not have a good reason for neuropathy such as diabetes. Consider trial of Reglan and Phenergan for nausea a CT abdomen does not show anything of substance.    Orders Placed This Encounter  Procedures  . CT Abdomen Pelvis W Contrast    Standing Status: Future     Number of Occurrences:      Standing Expiration Date: 01/20/2015    Order Specific Question:  Reason for Exam (SYMPTOM  OR DIAGNOSIS REQUIRED)    Answer:  Abd tenderness, nasuea    Order Specific Question:  [facility]    [facility]  . Comprehensive metabolic panel  . CBC with Differential  . TSH    Meds ordered this encounter  Medications  . losartan-hydrochlorothiazide (HYZAAR) 100-25 MG per tablet    Sig: Take 1 tablet by mouth daily.    Dispense:  30 tablet    Refill:  11

## 2013-10-19 NOTE — Assessment & Plan Note (Addendum)
EGD at [facility] in February- normal esophagus, chronic gastritis, multiple biopsies showing H. Pylori which was treated. Colonoscopy normal without diverticula Her abdominal exam has diffuse tenderness greatest in left lower quadrant initially I was concerned for diverticulosis, after reviewing [facility] chart in care everywhere in more detail I see that diverticulitis is unlikely. However I don't feel that this abdominal pain is easily explained by another process CT abdomen with still be useful. CT abd/pelvis Gastroparesis was mentioned once which would explain her nausea, however this does not have a good reason for neuropathy such as diabetes. Consider trial of Reglan and Phenergan for nausea a CT abdomen does not show anything of substance.

## 2013-10-19 NOTE — Assessment & Plan Note (Addendum)
Well-controlled with no red flags Patient continues to take nifedipine, never changed amlodipine Continue losartan, HCTZ, nifedipine Followup 6 month Labs today including CMP, CBC, TSH, also helping to evaluate for abdominal pain and nausea

## 2013-10-19 NOTE — Patient Instructions (Signed)
I am suspicious for diverticulitis or other intra-abdominal problems given your symptoms.   I will call and discuss your Ct scan results when they return  I will treat you for gastroparesis if your CT scan is normal  Lets follow up in about 1 month.

## 2013-10-22 ENCOUNTER — Encounter (HOSPITAL_COMMUNITY): Payer: Self-pay

## 2013-10-22 ENCOUNTER — Ambulatory Visit (HOSPITAL_COMMUNITY)
Admission: RE | Admit: 2013-10-22 | Discharge: 2013-10-22 | Disposition: A | Discharge status: Home / Self Care | Payer: No Typology Code available for payment source | Source: Ambulatory Visit | Attending: Family Medicine | Admitting: Family Medicine

## 2013-10-22 DIAGNOSIS — K7689 Other specified diseases of liver: Secondary | ICD-10-CM | POA: Insufficient documentation

## 2013-10-22 DIAGNOSIS — R112 Nausea with vomiting, unspecified: Secondary | ICD-10-CM

## 2013-10-22 DIAGNOSIS — K449 Diaphragmatic hernia without obstruction or gangrene: Secondary | ICD-10-CM | POA: Insufficient documentation

## 2013-10-22 DIAGNOSIS — J9819 Other pulmonary collapse: Secondary | ICD-10-CM | POA: Insufficient documentation

## 2013-10-22 MED ORDER — IOHEXOL 300 MG/ML  SOLN
100.0000 mL | Freq: Once | INTRAMUSCULAR | Status: AC | PRN
  Administered 2013-10-22: 100 mL via INTRAVENOUS

## 2013-10-22 MED ORDER — SODIUM CHLORIDE 0.9 % IJ SOLN
INTRAMUSCULAR | Status: AC
  Filled 2013-10-22: qty 50

## 2013-10-23 ENCOUNTER — Other Ambulatory Visit: Payer: Self-pay | Admitting: Family Medicine

## 2013-10-23 NOTE — Telephone Encounter (Signed)
Called, no answer, left VM that no urgent changes. Will call and discuss early next week.   Murtis SinkSam Bradshaw, MD [facility] Resident, PGY-3 10/23/2013, 2:22 PM

## 2013-10-26 ENCOUNTER — Telehealth: Payer: Self-pay | Admitting: Family Medicine

## 2013-10-26 NOTE — Telephone Encounter (Signed)
Called, no answer left VM that she can call or we can discuss in clinic.   She has multiple liver cysts, one is septated and the others are simple. CBC is WNL and without eos to make me think she has hydatid cysts or other parasitic infection. Her liver labs are also WNL. Will paln to re-CT in 6 months per discussion with preceptors.   Murtis SinkSam Bradshaw, MD [facility] Resident, PGY-3 10/26/2013, 5:44 PM

## 2013-11-28 ENCOUNTER — Encounter (HOSPITAL_COMMUNITY): Payer: Self-pay | Admitting: Emergency Medicine

## 2013-11-28 DIAGNOSIS — Z7982 Long term (current) use of aspirin: Secondary | ICD-10-CM | POA: Insufficient documentation

## 2013-11-28 DIAGNOSIS — N9489 Other specified conditions associated with female genital organs and menstrual cycle: Secondary | ICD-10-CM | POA: Insufficient documentation

## 2013-11-28 DIAGNOSIS — I10 Essential (primary) hypertension: Secondary | ICD-10-CM | POA: Insufficient documentation

## 2013-11-28 DIAGNOSIS — Z79899 Other long term (current) drug therapy: Secondary | ICD-10-CM | POA: Insufficient documentation

## 2013-11-28 DIAGNOSIS — R1032 Left lower quadrant pain: Secondary | ICD-10-CM | POA: Insufficient documentation

## 2013-11-28 DIAGNOSIS — M810 Age-related osteoporosis without current pathological fracture: Secondary | ICD-10-CM | POA: Insufficient documentation

## 2013-11-28 DIAGNOSIS — K219 Gastro-esophageal reflux disease without esophagitis: Secondary | ICD-10-CM | POA: Insufficient documentation

## 2013-11-28 DIAGNOSIS — E7800 Pure hypercholesterolemia, unspecified: Secondary | ICD-10-CM | POA: Insufficient documentation

## 2013-11-28 DIAGNOSIS — Z880 Allergy status to penicillin: Secondary | ICD-10-CM | POA: Insufficient documentation

## 2013-11-28 LAB — COMPREHENSIVE METABOLIC PANEL
ALK PHOS: 47 U/L (ref 39–117)
ALT: 11 U/L (ref 0–35)
ANION GAP: 15 (ref 5–15)
AST: 23 U/L (ref 0–37)
Albumin: 4.3 g/dL (ref 3.5–5.2)
BUN: 16 mg/dL (ref 6–23)
CHLORIDE: 100 meq/L (ref 96–112)
CO2: 28 mEq/L (ref 19–32)
CREATININE: 0.9 mg/dL (ref 0.50–1.10)
Calcium: 9.9 mg/dL (ref 8.4–10.5)
GFR calc Af Amer: 74 mL/min — ABNORMAL LOW (ref >90)
GFR calc non Af Amer: 64 mL/min — ABNORMAL LOW (ref >90)
GLUCOSE: 100 mg/dL — AB (ref 70–99)
Potassium: 3.8 mEq/L (ref 3.7–5.3)
Sodium: 143 mEq/L (ref 137–147)
Total Bilirubin: <0.2 mg/dL — ABNORMAL LOW (ref 0.3–1.2)
Total Protein: 7.7 g/dL (ref 6.0–8.3)

## 2013-11-28 LAB — CBC WITH DIFFERENTIAL/PLATELET
BASOS PCT: 1 % (ref 0–1)
Basophils Absolute: 0 10*3/uL (ref 0.0–0.1)
Eosinophils Absolute: 0.1 10*3/uL (ref 0.0–0.7)
Eosinophils Relative: 1 % (ref 0–5)
HEMATOCRIT: 33.7 % — AB (ref 36.0–46.0)
HEMOGLOBIN: 10.8 g/dL — AB (ref 12.0–15.0)
LYMPHS ABS: 1.8 10*3/uL (ref 0.7–4.0)
Lymphocytes Relative: 48 % — ABNORMAL HIGH (ref 12–46)
MCH: 30.1 pg (ref 26.0–34.0)
MCHC: 32 g/dL (ref 30.0–36.0)
MCV: 93.9 fL (ref 78.0–100.0)
MONO ABS: 0.4 10*3/uL (ref 0.1–1.0)
MONOS PCT: 9 % (ref 3–12)
NEUTROS ABS: 1.6 10*3/uL — AB (ref 1.7–7.7)
NEUTROS PCT: 41 % — AB (ref 43–77)
Platelets: 192 10*3/uL (ref 150–400)
RBC: 3.59 MIL/uL — AB (ref 3.87–5.11)
RDW: 13.7 % (ref 11.5–15.5)
WBC: 3.8 10*3/uL — ABNORMAL LOW (ref 4.0–10.5)

## 2013-11-28 LAB — LIPASE, BLOOD: LIPASE: 40 U/L (ref 11–59)

## 2013-11-28 NOTE — ED Notes (Signed)
Pt reports left sided lower abdominal pain since Monday that is progressively worsening. States pain comes and goes. Reports nausea, but denies emesis, diarrhea. Denies urinary symptoms. Pt denies CP, denies SOB. AO x4.

## 2013-11-29 ENCOUNTER — Emergency Department (HOSPITAL_COMMUNITY)
Admission: EM | Admit: 2013-11-29 | Discharge: 2013-11-29 | Disposition: A | Discharge status: Home / Self Care | Payer: Self-pay | Attending: Emergency Medicine | Admitting: Emergency Medicine

## 2013-11-29 ENCOUNTER — Emergency Department (HOSPITAL_COMMUNITY): Payer: No Typology Code available for payment source

## 2013-11-29 ENCOUNTER — Encounter (HOSPITAL_COMMUNITY): Payer: Self-pay | Admitting: Radiology

## 2013-11-29 DIAGNOSIS — N9489 Other specified conditions associated with female genital organs and menstrual cycle: Secondary | ICD-10-CM

## 2013-11-29 LAB — URINALYSIS, ROUTINE W REFLEX MICROSCOPIC
BILIRUBIN URINE: NEGATIVE
Glucose, UA: NEGATIVE mg/dL
Hgb urine dipstick: NEGATIVE
Ketones, ur: NEGATIVE mg/dL
NITRITE: NEGATIVE
PROTEIN: NEGATIVE mg/dL
Specific Gravity, Urine: 1.012 (ref 1.005–1.030)
UROBILINOGEN UA: 0.2 mg/dL (ref 0.0–1.0)
pH: 8.5 — ABNORMAL HIGH (ref 5.0–8.0)

## 2013-11-29 LAB — URINE MICROSCOPIC-ADD ON

## 2013-11-29 MED ORDER — HYDROCODONE-ACETAMINOPHEN 5-325 MG PO TABS
1.0000 | ORAL_TABLET | Freq: Once | ORAL | Status: AC
  Administered 2013-11-29: 1 via ORAL
  Filled 2013-11-29: qty 1

## 2013-11-29 MED ORDER — ONDANSETRON 8 MG PO TBDP: 8.0000 mg | ORAL_TABLET | Freq: Three times a day (TID) | ORAL | Status: DC | PRN

## 2013-11-29 MED ORDER — ONDANSETRON 4 MG PO TBDP
8.0000 mg | ORAL_TABLET | Freq: Once | ORAL | Status: AC
  Administered 2013-11-29: 8 mg via ORAL
  Filled 2013-11-29: qty 2

## 2013-11-29 MED ORDER — MORPHINE SULFATE 4 MG/ML IJ SOLN
4.0000 mg | INTRAMUSCULAR | Status: DC | PRN
  Administered 2013-11-29: 4 mg via INTRAVENOUS
  Filled 2013-11-29: qty 1

## 2013-11-29 MED ORDER — TRAMADOL HCL 50 MG PO TABS: 50.0000 mg | ORAL_TABLET | Freq: Four times a day (QID) | ORAL | Status: DC | PRN

## 2013-11-29 MED ORDER — IOHEXOL 300 MG/ML  SOLN
100.0000 mL | Freq: Once | INTRAMUSCULAR | Status: AC | PRN
  Administered 2013-11-29: 80 mL via INTRAVENOUS

## 2013-11-29 MED ORDER — ONDANSETRON 4 MG PO TBDP
4.0000 mg | ORAL_TABLET | Freq: Once | ORAL | Status: AC
  Administered 2013-11-29: 4 mg via ORAL
  Filled 2013-11-29: qty 1

## 2013-11-29 NOTE — Discharge Instructions (Signed)
We saw you in the ER for the abdominal pain. 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 The workup in the ER is not complete, and is limited to screening for life threatening and emergent conditions only, so please see a primary care doctor for further evaluation.   Pelvic Pain Female pelvic pain can be caused by many different things and start from a variety of places. Pelvic pain refers to pain that is located in the lower half of the abdomen and between your hips. The pain may occur over a short period of time (acute) or may be reoccurring (chronic). The cause of pelvic pain may be related to disorders affecting the female reproductive organs (gynecologic), but it may also be related to the bladder, kidney stones, an intestinal complication, or muscle or skeletal problems. Getting help right away for pelvic pain is important, especially if there has been severe, sharp, or a sudden onset of unusual pain. It is also important to get help right away because some types of pelvic pain can be life threatening.  CAUSES  Below are only some of the causes of pelvic pain. The causes of pelvic pain can be in one of several categories.   Gynecologic.  Pelvic inflammatory disease.  Sexually transmitted infection.  Ovarian cyst or a twisted ovarian ligament (ovarian torsion).  Uterine lining that grows outside the uterus (endometriosis).  Fibroids, cysts, or tumors.  Ovulation.  Pregnancy.  Pregnancy that occurs outside the uterus (ectopic pregnancy).  Miscarriage.  Labor.  Abruption of the placenta or ruptured uterus.  Infection.  Uterine infection (endometritis).  Bladder infection.  Diverticulitis.  Miscarriage related to a uterine infection (septic abortion).  Bladder.  Inflammation of the bladder (cystitis).  Kidney  stone(s).  Gastrointestinal.  Constipation.  Diverticulitis.  Neurologic.  Trauma.  Feeling pelvic pain because of mental or emotional causes (psychosomatic).  Cancers of the bowel or pelvis. EVALUATION  Your caregiver will want to take a careful history of your concerns. This includes recent changes in your health, a careful gynecologic history of your periods (menses), and a sexual history. Obtaining your family history and medical history is also important. Your caregiver may suggest a pelvic exam. A pelvic exam will help identify the location and severity of the pain. It also helps in the evaluation of which organ system may be involved. In order to identify the cause of the pelvic pain and be properly treated, your caregiver may order tests. These tests may include:   A pregnancy test.  Pelvic ultrasonography.  An X-ray exam of the abdomen.  A urinalysis or evaluation of vaginal discharge.  Blood tests. HOME CARE INSTRUCTIONS   Only take over-the-counter or prescription medicines for pain, discomfort, or fever as directed by your caregiver.   Rest as directed by your caregiver.   Eat a balanced diet.   Drink enough fluids to make your urine clear or pale yellow, or as directed.   Avoid sexual intercourse if it causes pain.   Apply warm or cold compresses to the lower abdomen depending on which one helps the pain.   Avoid stressful situations.   Keep a journal of your pelvic pain. Write down when it started, where the pain is located, and if there are things that seem to be associated with the pain, such as food or your menstrual cycle.  Follow up with your caregiver as directed.  SEEK MEDICAL CARE IF:  Your  medicine does not help your pain.  You have abnormal vaginal discharge. SEEK IMMEDIATE MEDICAL CARE IF:   You have heavy bleeding from the vagina.   Your pelvic pain increases.   You feel light-headed or faint.   You have chills.   You  have pain with urination or blood in your urine.   You have uncontrolled diarrhea or vomiting.   You have a fever or persistent symptoms for more than 3 days.  You have a fever and your symptoms suddenly get worse.   You are being physically or sexually abused.  MAKE SURE YOU:  Understand these instructions.  Will watch your condition.  Will get help if you are not doing well or get worse. Document Released: 02/28/2004 Document Revised: 08/17/2013 Document Reviewed: 07/23/2011 [facility] Patient Information 2015 [employer] [place]. This information is not intended to replace advice given to you by your health care provider. Make sure you discuss any questions you have with your health care provider.

## 2013-11-29 NOTE — ED Provider Notes (Addendum)
CSN: [id number]     Arrival date & time 11/28/13  2206 History   First MD Initiated Contact with Patient 11/29/13 0259     Chief Complaint  Patient presents with  . Abdominal Pain     (Consider location/radiation/quality/duration/timing/severity/associated sxs/prior Treatment) HPI Comments: Pt comes in with cc of LLQ abd pain. Pan onset on Monday, initially responding to tylenol, but now getting worse. PAin is constant, and has waxing and waning in nature, and there is no UTI like sx. No hx of similar pain. + ovarian cyst on the left sided - removed. No hx of renal stones. Pt has GERD, HTN.  Patient is a 68 y.o. female presenting with abdominal pain. The history is provided by the patient.  Abdominal Pain Associated symptoms: no chest pain, no constipation, no cough, no diarrhea, no dysuria, no hematuria, no nausea, no shortness of breath and no vomiting     Past Medical History  Diagnosis Date  . Acid reflux   . Hypertension   . Hypercholesteremia   . Osteoporosis   . Irregular heart beat     PVC's   History reviewed. No pertinent past surgical history. Family History  Problem Relation Age of Onset  . Breast cancer Mother   . Lung cancer Sister   . Coronary artery disease     History  Substance Use Topics  . Smoking status: Never Smoker   . Smokeless tobacco: Never Used  . Alcohol Use: No   OB History   Grav Para Term Preterm Abortions TAB SAB Ect Mult Living                 Review of Systems  Constitutional: Negative for activity change.  HENT: Negative for facial swelling.   Respiratory: Negative for cough, shortness of breath and wheezing.   Cardiovascular: Negative for chest pain.  Gastrointestinal: Positive for abdominal pain. Negative for nausea, vomiting, diarrhea, constipation, blood in stool and abdominal distention.  Genitourinary: Negative for dysuria, hematuria and difficulty urinating.  Musculoskeletal: Negative for neck pain.  Skin: Negative for  color change.  Neurological: Negative for speech difficulty.  Hematological: Does not bruise/bleed easily.  Psychiatric/Behavioral: Negative for confusion.      Allergies  Azithromycin and Penicillins  Home Medications   Prior to Admission medications   Medication Sig Start Date End Date Taking? Authorizing Provider  alendronate (FOSAMAX) 70 MG tablet Take 70 mg by mouth once a week. Take with a full glass of water on an empty stomach. On Thursday   Yes Historical Provider, MD  aspirin EC 81 MG tablet Take 81 mg by mouth 3 (three) times a week. On Monday, Wednesday and Friday   Yes Historical Provider, MD  calcium-vitamin D (OSCAL WITH D) 500-200 MG-UNIT per tablet Take 1 tablet by mouth 2 (two) times daily.    Yes Historical Provider, MD  fluticasone (FLONASE) 50 MCG/ACT nasal spray Place 2 sprays into both nostrils daily.   Yes Historical Provider, MD  latanoprost (XALATAN) 0.005 % ophthalmic solution Place 1 drop into both eyes at bedtime.   Yes Historical Provider, MD  losartan-hydrochlorothiazide (HYZAAR) 100-25 MG per tablet Take 1 tablet by mouth daily.   Yes Historical Provider, MD  naproxen sodium (ANAPROX) 220 MG tablet Take 220 mg by mouth daily as needed (for pain).   Yes Historical Provider, MD  NIFEdipine (PROCARDIA-XL/ADALAT-CC/NIFEDICAL-XL) 30 MG 24 hr tablet Take 30 mg by mouth daily.   Yes Historical Provider, MD  pantoprazole (PROTONIX) 40 MG tablet Take  40 mg by mouth 2 (two) times daily.   Yes Historical Provider, MD  simvastatin (ZOCOR) 40 MG tablet Take 40 mg by mouth at bedtime.   Yes Historical Provider, MD  sucralfate (CARAFATE) 1 GM/10ML suspension Take 1 g by mouth 4 (four) times daily -  with meals and at bedtime.   Yes Historical Provider, MD  traMADol (ULTRAM) 50 MG tablet Take 1 tablet (50 mg total) by mouth every 6 (six) hours as needed. 11/29/13   Javar Eshbach, MD   BP 116/58  Pulse 56  Temp(Src) 98.1 F (36.7 C) (Oral)  Resp 14  Wt 135 lb (61.236  kg)  SpO2 98% Physical Exam  Nursing note and vitals reviewed. Constitutional: She is oriented to person, place, and time. She appears well-developed and well-nourished.  HENT:  Head: Normocephalic and atraumatic.  Eyes: EOM are normal. Pupils are equal, round, and reactive to light.  Neck: Neck supple.  Cardiovascular: Normal rate, regular rhythm and normal heart sounds.   No murmur heard. Pulmonary/Chest: Effort normal. No respiratory distress.  Abdominal: Soft. She exhibits no distension. There is tenderness. There is guarding. There is no rebound.  Bilateral lower quadrant tenderness, with some guarding, but no rebound  Neurological: She is alert and oriented to person, place, and time.  Skin: Skin is warm and dry.    ED Course  Procedures (including critical care time) Labs Review Labs Reviewed  CBC WITH DIFFERENTIAL - Abnormal; Notable for the following:    WBC 3.8 (*)    RBC 3.59 (*)    Hemoglobin 10.8 (*)    HCT 33.7 (*)    Neutrophils Relative % 41 (*)    Neutro Abs 1.6 (*)    Lymphocytes Relative 48 (*)    All other components within normal limits  COMPREHENSIVE METABOLIC PANEL - Abnormal; Notable for the following:    Glucose, Bld 100 (*)    Total Bilirubin <0.2 (*)    GFR calc non Af Amer 64 (*)    GFR calc Af Amer 74 (*)    All other components within normal limits  URINALYSIS, ROUTINE W REFLEX MICROSCOPIC - Abnormal; Notable for the following:    APPearance CLOUDY (*)    pH 8.5 (*)    Leukocytes, UA TRACE (*)    All other components within normal limits  LIPASE, BLOOD  URINE MICROSCOPIC-ADD ON    Imaging Review Ct Abdomen Pelvis W Contrast  11/29/2013   CLINICAL DATA:  Left lower quadrant abdominal pain and left flank pain. Nausea, vomiting and diarrhea.  EXAM: CT ABDOMEN AND PELVIS WITH CONTRAST  TECHNIQUE: Multidetector CT imaging of the abdomen and pelvis was performed using the standard protocol following bolus administration of intravenous contrast.   CONTRAST:  80mL OMNIPAQUE IOHEXOL 300 MG/ML  SOLN  COMPARISON:  CT of the abdomen and pelvis performed 10/22/2013  FINDINGS: Right basilar atelectasis is noted, with underlying bronchiectasis. Minimal left basilar atelectasis is noted.  Scattered hepatic cysts are grossly unchanged in appearance, measuring up to 3.5 cm in size. The spleen is unremarkable in appearance. The gallbladder is within normal limits. The pancreas and adrenal glands are unremarkable.  The kidneys are unremarkable in appearance. There is no evidence of hydronephrosis. No renal or ureteral stones are seen. No perinephric stranding is appreciated.  No free fluid is identified. The small bowel is unremarkable in appearance. The stomach is within normal limits. No acute vascular abnormalities are seen. Scattered calcification is seen along the abdominal aorta and its  branches.  The appendix is normal in caliber and contains air, without evidence for appendicitis. Mild scattered diverticulosis is noted along the distal descending and proximal sigmoid colon, without evidence of diverticulitis. The colon is otherwise unremarkable.  The bladder is mildly distended and grossly unremarkable. The uterus is unremarkable in appearance. The ovaries are relatively symmetric. Note is made of prominence of the periuterine vasculature, extending about left ovary. This could reflect pelvic congestion syndrome in the appropriate clinical situation. No suspicious adnexal masses are seen. No inguinal lymphadenopathy is seen.  No acute osseous abnormalities are identified.  IMPRESSION: 1. No acute abnormalities seen within the abdomen or pelvis. 2. Scattered diverticulosis along the distal descending and proximal sigmoid colon, without evidence of diverticulitis. 3. Prominence of the periuterine vasculature again noted, extending about the left ovary. This could reflect pelvic congestion syndrome in the appropriate clinical situation, and can explain chronic lower  abdominal pain. 4. Scattered calcification along the abdominal aorta and its branches. 5. Right basilar atelectasis noted, with underlying bronchiectasis. 6. Scattered hepatic cysts again noted.   Electronically Signed   By: Roanna Raider M.D.   On: 11/29/2013 04:59     EKG Interpretation None      MDM   Final diagnoses:  Pelvic congestion syndrome   Pt comes in with cc of LLQ abd pain. On exam she has diffuse lower quadrant pain.  DDx includes: Colitis AAA Tumors Colitis Intra abdominal abscess Thrombosis Mesenteric ischemia Diverticulitis Peritonitis Appendicitis Hernia Nephrolithiasis Pyelonephritis UTI/Cystitis Ovarian cyst  Pt had a CT done recently -and there was some diverticular dz. However, she has no diverticulitis hx, and no fevers, blood stools etc - so we had to get a repeat CT, and it shows pelvic congestion syndrome. Results discussed with the patient, and patient has been advised to see PCP for further evaluation.  Derwood Kaplan, MD 11/29/13 1610  Derwood Kaplan, MD 11/29/13 [phone]

## 2013-11-29 NOTE — ED Notes (Signed)
MD at bedside. 

## 2013-12-07 ENCOUNTER — Other Ambulatory Visit: Payer: Self-pay | Admitting: Family Medicine

## 2013-12-07 DIAGNOSIS — Z1231 Encounter for screening mammogram for malignant neoplasm of breast: Secondary | ICD-10-CM

## 2013-12-14 ENCOUNTER — Encounter (HOSPITAL_COMMUNITY): Payer: Self-pay | Admitting: *Deleted

## 2013-12-14 ENCOUNTER — Inpatient Hospital Stay (HOSPITAL_COMMUNITY): Payer: No Typology Code available for payment source

## 2013-12-14 ENCOUNTER — Ambulatory Visit (HOSPITAL_COMMUNITY)
Admission: RE | Admit: 2013-12-14 | Discharge: 2013-12-14 | Disposition: A | Discharge status: Home / Self Care | Payer: No Typology Code available for payment source | Source: Ambulatory Visit | Attending: Family Medicine | Admitting: Family Medicine

## 2013-12-14 ENCOUNTER — Inpatient Hospital Stay (HOSPITAL_COMMUNITY)
Admission: AD | Admit: 2013-12-14 | Discharge: 2013-12-14 | Disposition: A | Discharge status: Home / Self Care | Payer: No Typology Code available for payment source | Source: Ambulatory Visit | Attending: Obstetrics and Gynecology | Admitting: Obstetrics and Gynecology

## 2013-12-14 DIAGNOSIS — Z1231 Encounter for screening mammogram for malignant neoplasm of breast: Secondary | ICD-10-CM

## 2013-12-14 DIAGNOSIS — K219 Gastro-esophageal reflux disease without esophagitis: Secondary | ICD-10-CM | POA: Insufficient documentation

## 2013-12-14 DIAGNOSIS — N9489 Other specified conditions associated with female genital organs and menstrual cycle: Secondary | ICD-10-CM | POA: Insufficient documentation

## 2013-12-14 DIAGNOSIS — I10 Essential (primary) hypertension: Secondary | ICD-10-CM | POA: Insufficient documentation

## 2013-12-14 DIAGNOSIS — R1032 Left lower quadrant pain: Secondary | ICD-10-CM | POA: Insufficient documentation

## 2013-12-14 LAB — URINALYSIS, ROUTINE W REFLEX MICROSCOPIC
Bilirubin Urine: NEGATIVE
GLUCOSE, UA: NEGATIVE mg/dL
Ketones, ur: NEGATIVE mg/dL
Leukocytes, UA: NEGATIVE
Nitrite: NEGATIVE
Protein, ur: NEGATIVE mg/dL
SPECIFIC GRAVITY, URINE: 1.01 (ref 1.005–1.030)
UROBILINOGEN UA: 0.2 mg/dL (ref 0.0–1.0)
pH: 8 (ref 5.0–8.0)

## 2013-12-14 LAB — URINE MICROSCOPIC-ADD ON

## 2013-12-14 LAB — WET PREP, GENITAL
CLUE CELLS WET PREP: NONE SEEN
Trich, Wet Prep: NONE SEEN
WBC, Wet Prep HPF POC: NONE SEEN
YEAST WET PREP: NONE SEEN

## 2013-12-14 MED ORDER — OXYCODONE-ACETAMINOPHEN 5-325 MG PO TABS
2.0000 | ORAL_TABLET | ORAL | Status: AC
  Administered 2013-12-14: 2 via ORAL
  Filled 2013-12-14: qty 2

## 2013-12-14 MED ORDER — OXYCODONE-ACETAMINOPHEN 5-325 MG PO TABS: 1.0000 | ORAL_TABLET | Freq: Four times a day (QID) | ORAL | Status: DC | PRN

## 2013-12-14 MED ORDER — KETOROLAC TROMETHAMINE 60 MG/2ML IM SOLN
60.0000 mg | INTRAMUSCULAR | Status: AC
  Administered 2013-12-14: 60 mg via INTRAMUSCULAR
  Filled 2013-12-14: qty 2

## 2013-12-14 NOTE — Discharge Instructions (Signed)
Abdominal Pain, Women °Abdominal (stomach, pelvic, or belly) pain can be caused by many things. It is important to tell your doctor: °· The location of the pain. °· Does it come and go or is it present all the time? °· Are there things that start the pain (eating certain foods, exercise)? °· Are there other symptoms associated with the pain (fever, nausea, vomiting, diarrhea)? °All of this is helpful to know when trying to find the cause of the pain. °CAUSES  °· Stomach: virus or bacteria infection, or ulcer. °· Intestine: appendicitis (inflamed appendix), regional ileitis (Crohn's disease), ulcerative colitis (inflamed colon), irritable bowel syndrome, diverticulitis (inflamed diverticulum of the colon), or cancer of the stomach or intestine. °· Gallbladder disease or stones in the gallbladder. °· Kidney disease, kidney stones, or infection. °· Pancreas infection or cancer. °· Fibromyalgia (pain disorder). °· Diseases of the female organs: °¨ Uterus: fibroid (non-cancerous) tumors or infection. °¨ Fallopian tubes: infection or tubal pregnancy. °¨ Ovary: cysts or tumors. °¨ Pelvic adhesions (scar tissue). °¨ Endometriosis (uterus lining tissue growing in the pelvis and on the pelvic organs). °¨ Pelvic congestion syndrome (female organs filling up with blood just before the menstrual period). °¨ Pain with the menstrual period. °¨ Pain with ovulation (producing an egg). °¨ Pain with an IUD (intrauterine device, birth control) in the uterus. °¨ Cancer of the female organs. °· Functional pain (pain not caused by a disease, may improve without treatment). °· Psychological pain. °· Depression. °DIAGNOSIS  °Your doctor will decide the seriousness of your pain by doing an examination. °· Blood tests. °· X-rays. °· Ultrasound. °· CT scan (computed tomography, special type of X-ray). °· MRI (magnetic resonance imaging). °· Cultures, for infection. °· Barium enema (dye inserted in the large intestine, to better view it with  X-rays). °· Colonoscopy (looking in intestine with a lighted tube). °· Laparoscopy (minor surgery, looking in abdomen with a lighted tube). °· Major abdominal exploratory surgery (looking in abdomen with a large incision). °TREATMENT  °The treatment will depend on the cause of the pain.  °· Many cases can be observed and treated at home. °· Over-the-counter medicines recommended by your caregiver. °· Prescription medicine. °· Antibiotics, for infection. °· Birth control pills, for painful periods or for ovulation pain. °· Hormone treatment, for endometriosis. °· Nerve blocking injections. °· Physical therapy. °· Antidepressants. °· Counseling with a psychologist or psychiatrist. °· Minor or major surgery. °HOME CARE INSTRUCTIONS  °· Do not take laxatives, unless directed by your caregiver. °· Take over-the-counter pain medicine only if ordered by your caregiver. Do not take aspirin because it can cause an upset stomach or bleeding. °· Try a clear liquid diet (broth or water) as ordered by your caregiver. Slowly move to a bland diet, as tolerated, if the pain is related to the stomach or intestine. °· Have a thermometer and take your temperature several times a day, and record it. °· Bed rest and sleep, if it helps the pain. °· Avoid sexual intercourse, if it causes pain. °· Avoid stressful situations. °· Keep your follow-up appointments and tests, as your caregiver orders. °· If the pain does not go away with medicine or surgery, you may try: °¨ Acupuncture. °¨ Relaxation exercises (yoga, meditation). °¨ Group therapy. °¨ Counseling. °SEEK MEDICAL CARE IF:  °· You notice certain foods cause stomach pain. °· Your home care treatment is not helping your pain. °· You need stronger pain medicine. °· You want your IUD removed. °· You feel faint or   lightheaded. °· You develop nausea and vomiting. °· You develop a rash. °· You are having side effects or an allergy to your medicine. °SEEK IMMEDIATE MEDICAL CARE IF:  °· Your  pain does not go away or gets worse. °· You have a fever. °· Your pain is felt only in portions of the abdomen. The right side could possibly be appendicitis. The left lower portion of the abdomen could be colitis or diverticulitis. °· You are passing blood in your stools (bright red or black tarry stools, with or without vomiting). °· You have blood in your urine. °· You develop chills, with or without a fever. °· You pass out. °MAKE SURE YOU:  °· Understand these instructions. °· Will watch your condition. °· Will get help right away if you are not doing well or get worse. °Document Released: 01/28/2007 Document Revised: 08/17/2013 Document Reviewed: 02/17/2009 °[employer]® Patient Information ©2015 [employer]. This information is not intended to replace advice given to you by your health care provider. Make sure you discuss any questions you have with your health care provider. ° °

## 2013-12-14 NOTE — MAU Provider Note (Signed)
Chief Complaint: Abdominal Pain   None    SUBJECTIVE HPI: Kristy Cox is a 68 y.o. G2P2002 who presents to maternity admissions reporting LLQ pain, intermittent for a few months but mild until the last few weeks when it has increased in intensity.  The pain became severe 4-5 days ago, unrelieved by Aleve and tramadol.  She has appt later this month in Gyn clinic but came in today because the pain is too severe and she cannot wait until her appt.  Last menstrual period >30 years ago.  She denies vaginal bleeding, vaginal itching/burning, urinary symptoms, h/a, dizziness, n/v, or fever/chills.     Past Medical History  Diagnosis Date  . Acid reflux   . Hypertension   . Hypercholesteremia   . Osteoporosis   . Irregular heart beat     PVC's   History reviewed. No pertinent past surgical history. History   Social History  . Marital Status: Single    Spouse Name: N/A    Number of Children: N/A  . Years of Education: N/A   Occupational History  . Not on file.   Social History Main Topics  . Smoking status: Never Smoker   . Smokeless tobacco: Never Used  . Alcohol Use: No  . Drug Use: No  . Sexual Activity: Not Currently   Other Topics Concern  . Not on file   Social History Narrative  . No narrative on file   No current facility-administered medications on file prior to encounter.   Current Outpatient Prescriptions on File Prior to Encounter  Medication Sig Dispense Refill  . calcium-vitamin D (OSCAL WITH D) 500-200 MG-UNIT per tablet Take 1 tablet by mouth daily.       Marland Kitchen latanoprost (XALATAN) 0.005 % ophthalmic solution Place 1 drop into both eyes at bedtime.      Marland Kitchen losartan-hydrochlorothiazide (HYZAAR) 100-25 MG per tablet Take 1 tablet by mouth daily.      . naproxen sodium (ANAPROX) 220 MG tablet Take 220 mg by mouth daily as needed (for pain).      Marland Kitchen NIFEdipine (PROCARDIA-XL/ADALAT-CC/NIFEDICAL-XL) 30 MG 24 hr tablet Take 30 mg by mouth daily.      . pantoprazole  (PROTONIX) 40 MG tablet Take 40 mg by mouth 2 (two) times daily.      . simvastatin (ZOCOR) 40 MG tablet Take 40 mg by mouth at bedtime.       Allergies  Allergen Reactions  . Azithromycin Nausea And Vomiting  . Penicillins Other (See Comments)    "didn't feel like myself"    ROS: Pertinent items in HPI  OBJECTIVE Blood pressure 115/92, pulse 62, temperature 98.1 F (36.7 C), temperature source Oral, resp. rate 18. GENERAL: Well-developed, well-nourished female in no acute distress.  HEENT: Normocephalic HEART: normal rate RESP: normal effort ABDOMEN: Soft, non-tender EXTREMITIES: Nontender, no edema NEURO: Alert and oriented Pelvic exam: Cervix pink, visually closed, without lesion, scant white creamy discharge, vaginal walls and external genitalia normal Bimanual exam: Cervix 0/long/high, firm, anterior, neg CMT, uterus nontender, nonenlarged, significant tenderness in left adnexal region, none on right, no palpable enlargement or mass  LAB RESULTS No results found for this or any previous visit (from the past 24 hour(s)).  IMAGING US Transvaginal Non-ob  12/14/2013   CLINICAL DATA:  Left lower quadrant pain, history of left ovarian cyst, possible pelvic congestion on CT  EXAM: TRANSABDOMINAL AND TRANSVAGINAL ULTRASOUND OF PELVIS  TECHNIQUE: Both transabdominal and transvaginal ultrasound examinations of the pelvis were performed. Transabdominal technique  was performed for global imaging of the pelvis including uterus, ovaries, adnexal regions, and pelvic cul-de-sac. It was necessary to proceed with endovaginal exam following the transabdominal exam to visualize the endometrium and right adnexa.  COMPARISON:  CT abdomen pelvis dated 11/29/2013  FINDINGS: Uterus  Measurements: 5.5 x 3.3 x 3.5 cm. Retroverted. No fibroids or other mass visualized.  Endometrium  Thickness: 2 mm. Fluid within the endometrial cavity. No focal abnormality visualized.  Right ovary  Not discretely visualized  transabdominally or transvaginally.  Left ovary  Measurements: 1.0 x 0.5 x 0.6 cm. Normal appearance/no adnexal mass.  Other findings  No free fluid. Again noted is mildly prominent pelvic vasculature in the bilateral adnexa, right greater than left on [place], nonspecific and better evaluated on CT.  IMPRESSION: Fluid within the endometrial cavity. In the postmenopausal setting, this could reflect cervical stenosis.  Right ovary is not discretely visualized.   Electronically Signed   By: Charline Bills M.D.   On: 12/14/2013 16:55   US Pelvis Complete  12/14/2013   CLINICAL DATA:  Left lower quadrant pain, history of left ovarian cyst, possible pelvic congestion on CT  EXAM: TRANSABDOMINAL AND TRANSVAGINAL ULTRASOUND OF PELVIS  TECHNIQUE: Both transabdominal and transvaginal ultrasound examinations of the pelvis were performed. Transabdominal technique was performed for global imaging of the pelvis including uterus, ovaries, adnexal regions, and pelvic cul-de-sac. It was necessary to proceed with endovaginal exam following the transabdominal exam to visualize the endometrium and right adnexa.  COMPARISON:  CT abdomen pelvis dated 11/29/2013  FINDINGS: Uterus  Measurements: 5.5 x 3.3 x 3.5 cm. Retroverted. No fibroids or other mass visualized.  Endometrium  Thickness: 2 mm. Fluid within the endometrial cavity. No focal abnormality visualized.  Right ovary  Not discretely visualized transabdominally or transvaginally.  Left ovary  Measurements: 1.0 x 0.5 x 0.6 cm. Normal appearance/no adnexal mass.  Other findings  No free fluid. Again noted is mildly prominent pelvic vasculature in the bilateral adnexa, right greater than left on [place], nonspecific and better evaluated on CT.  IMPRESSION: Fluid within the endometrial cavity. In the postmenopausal setting, this could reflect cervical stenosis.  Right ovary is not discretely visualized.   Electronically Signed   By: Charline Bills M.D.   On: 12/14/2013 16:55   Ct  Abdomen Pelvis W Contrast  11/29/2013   CLINICAL DATA:  Left lower quadrant abdominal pain and left flank pain. Nausea, vomiting and diarrhea.  EXAM: CT ABDOMEN AND PELVIS WITH CONTRAST  TECHNIQUE: Multidetector CT imaging of the abdomen and pelvis was performed using the standard protocol following bolus administration of intravenous contrast.  CONTRAST:  80mL OMNIPAQUE IOHEXOL 300 MG/ML  SOLN  COMPARISON:  CT of the abdomen and pelvis performed 10/22/2013  FINDINGS: Right basilar atelectasis is noted, with underlying bronchiectasis. Minimal left basilar atelectasis is noted.  Scattered hepatic cysts are grossly unchanged in appearance, measuring up to 3.5 cm in size. The spleen is unremarkable in appearance. The gallbladder is within normal limits. The pancreas and adrenal glands are unremarkable.  The kidneys are unremarkable in appearance. There is no evidence of hydronephrosis. No renal or ureteral stones are seen. No perinephric stranding is appreciated.  No free fluid is identified. The small bowel is unremarkable in appearance. The stomach is within normal limits. No acute vascular abnormalities are seen. Scattered calcification is seen along the abdominal aorta and its branches.  The appendix is normal in caliber and contains air, without evidence for appendicitis. Mild scattered diverticulosis is noted  along the distal descending and proximal sigmoid colon, without evidence of diverticulitis. The colon is otherwise unremarkable.  The bladder is mildly distended and grossly unremarkable. The uterus is unremarkable in appearance. The ovaries are relatively symmetric. Note is made of prominence of the periuterine vasculature, extending about left ovary. This could reflect pelvic congestion syndrome in the appropriate clinical situation. No suspicious adnexal masses are seen. No inguinal lymphadenopathy is seen.  No acute osseous abnormalities are identified.  IMPRESSION: 1. No acute abnormalities seen within  the abdomen or pelvis. 2. Scattered diverticulosis along the distal descending and proximal sigmoid colon, without evidence of diverticulitis. 3. Prominence of the periuterine vasculature again noted, extending about the left ovary. This could reflect pelvic congestion syndrome in the appropriate clinical situation, and can explain chronic lower abdominal pain. 4. Scattered calcification along the abdominal aorta and its branches. 5. Right basilar atelectasis noted, with underlying bronchiectasis. 6. Scattered hepatic cysts again noted.   Electronically Signed   By: Roanna Raider M.D.   On: 11/29/2013 04:59    ASSESSMENT 1. LLQ pain   2. Pelvic congestion syndrome     PLAN Discharge home Percocet 5/325, take 1-2 tabs Q 6 hours PRN x 20 tabs Continue Aleve Q 6 hours Keep f/u appointment with Dr Debroah Loop as scheduled Return to MAU as needed for emergencies    Medication List    STOP taking these medications       traMADol 50 MG tablet  Commonly known as:  ULTRAM      TAKE these medications       calcium-vitamin D 500-200 MG-UNIT per tablet  Commonly known as:  OSCAL WITH D  Take 1 tablet by mouth daily.     latanoprost 0.005 % ophthalmic solution  Commonly known as:  XALATAN  Place 1 drop into both eyes at bedtime.     losartan-hydrochlorothiazide 100-25 MG per tablet  Commonly known as:  HYZAAR  Take 1 tablet by mouth daily.     naproxen sodium 220 MG tablet  Commonly known as:  ANAPROX  Take 220 mg by mouth daily as needed (for pain).     NIFEdipine 30 MG 24 hr tablet  Commonly known as:  PROCARDIA-XL/ADALAT-CC/NIFEDICAL-XL  Take 30 mg by mouth daily.     oxyCODONE-acetaminophen 5-325 MG per tablet  Commonly known as:  PERCOCET/ROXICET  Take 1-2 tablets by mouth every 6 (six) hours as needed.     pantoprazole 40 MG tablet  Commonly known as:  PROTONIX  Take 40 mg by mouth 2 (two) times daily.     simvastatin 40 MG tablet  Commonly known as:  ZOCOR  Take 40 mg by  mouth at bedtime.       Follow-up Information   Follow up with [facility]. (As scheduled)    Specialty:  Obstetrics and Gynecology   Contact information:   [address] [place] [address] [phone]      Follow up with [facility] [place] [facility]. (As needed for emergencies)    Contact information:   [address] [id number] [place] [address] [phone]      Sharen Counter Certified Nurse-Midwife 12/16/2013  3:49 AM

## 2013-12-14 NOTE — MAU Note (Signed)
Pain in LLQ, also L side for the last 3 months.  Was seen @ [facility] earlier this month for this problem.  Has appt 9/18 with GYN clinic.  Is taking pain meds but still having severe pain.  Denies vag bleeding.

## 2013-12-18 NOTE — MAU Provider Note (Signed)
Attestation of Attending Supervision of Advanced Practitioner (CNM/NP): Evaluation and management procedures were performed by the Advanced Practitioner under my supervision and collaboration.  I have reviewed the Advanced Practitioner's note and chart, and I agree with the management and plan.  Meily Glowacki 12/18/2013 8:55 AM

## 2013-12-30 ENCOUNTER — Other Ambulatory Visit: Payer: Self-pay | Admitting: *Deleted

## 2013-12-30 NOTE — Telephone Encounter (Signed)
No clear record of med being requested. Will ask nursing to call and ask when she last used and who prescribed it, If prescribed by ophtho then it is probably best refilled by them.   Murtis Sink, MD [facility] Resident, PGY-3 12/30/2013, 12:39 PM

## 2013-12-30 NOTE — Telephone Encounter (Signed)
Pt informed. Says that dr Piedad Climes prescribed her that rx and she doesn't need a refill as of now. [place], [place] CMA

## 2014-01-01 ENCOUNTER — Ambulatory Visit (INDEPENDENT_AMBULATORY_CARE_PROVIDER_SITE_OTHER): Payer: No Typology Code available for payment source | Admitting: Obstetrics & Gynecology

## 2014-01-01 ENCOUNTER — Encounter: Payer: Self-pay | Admitting: Obstetrics & Gynecology

## 2014-01-01 VITALS — BP 130/62 | HR 57 | Temp 98.1°F | Wt 137.1 lb

## 2014-01-01 DIAGNOSIS — K5730 Diverticulosis of large intestine without perforation or abscess without bleeding: Secondary | ICD-10-CM | POA: Insufficient documentation

## 2014-01-01 DIAGNOSIS — R109 Unspecified abdominal pain: Secondary | ICD-10-CM

## 2014-01-01 NOTE — Progress Notes (Signed)
Patient ID: Kristy Cox, female   DOB: 10-Jul-1945, 68 y.o.   MRN: [id number]  Chief Complaint  Patient presents with  . Gynecologic Exam    HPI Kristy Cox is a 68 y.o. female.  [id number] (adult daughter died 2 years ago)  No LMP recorded. Patient is postmenopausal.  3 months of LLQ left side discomfort usually not severe, CT showed possible pelvic congestion in left side. HPI  Past Medical History  Diagnosis Date  . Acid reflux   . Hypertension   . Hypercholesteremia   . Osteoporosis   . Irregular heart beat     PVC's    History reviewed. No pertinent past surgical history.  Family History  Problem Relation Age of Onset  . Breast cancer Mother   . Lung cancer Sister   . Coronary artery disease      Social History History  Substance Use Topics  . Smoking status: Never Smoker   . Smokeless tobacco: Never Used  . Alcohol Use: No    Allergies  Allergen Reactions  . Azithromycin Nausea And Vomiting  . Penicillins Other (See Comments)    "didn't feel like myself"    Current Outpatient Prescriptions  Medication Sig Dispense Refill  . calcium-vitamin D (OSCAL WITH D) 500-200 MG-UNIT per tablet Take 1 tablet by mouth daily.       Marland Kitchen latanoprost (XALATAN) 0.005 % ophthalmic solution Place 1 drop into both eyes at bedtime.      Marland Kitchen losartan-hydrochlorothiazide (HYZAAR) 100-25 MG per tablet Take 1 tablet by mouth daily.      . naproxen sodium (ANAPROX) 220 MG tablet Take 220 mg by mouth daily as needed (for pain).      Marland Kitchen NIFEdipine (PROCARDIA-XL/ADALAT-CC/NIFEDICAL-XL) 30 MG 24 hr tablet Take 30 mg by mouth daily.      . pantoprazole (PROTONIX) 40 MG tablet Take 40 mg by mouth 2 (two) times daily.      . simvastatin (ZOCOR) 40 MG tablet Take 40 mg by mouth at bedtime.      Marland Kitchen oxyCODONE-acetaminophen (PERCOCET/ROXICET) 5-325 MG per tablet Take 1-2 tablets by mouth every 6 (six) hours as needed.  20 tablet  0   No current facility-administered medications for this visit.     Review of Systems Review of Systems  Constitutional: Negative.   Gastrointestinal: Positive for nausea and abdominal pain. Negative for vomiting, diarrhea, constipation and anal bleeding.  Genitourinary: Positive for pelvic pain. Negative for dysuria, hematuria, vaginal bleeding and vaginal discharge.    Blood pressure 130/62, pulse 57, temperature 98.1 F (36.7 C), weight 137 lb 1.6 oz (62.188 kg).  Physical Exam Physical Exam  Constitutional: She appears well-developed. No distress.  Pulmonary/Chest: Effort normal. No respiratory distress.  Abdominal: Soft. She exhibits no mass. There is tenderness (mild on left). There is no rebound and no guarding.  Genitourinary: No vaginal discharge found.  Pelvic exam: atrophic vaginal and cervix, uterus normal, mild left side tenderness no mass    Data Reviewed  Author: Hurshel Party, CNM Service: Obstetrics Author Type: Certified Nurse Midwife    Filed: 12/16/2013  3:50 AM Note Time: 12/14/2013  5:04 PM Status: Signed    Editor: Hurshel Party, CNM (Certified Nurse Midwife)        Related Notes: Cosigned by Catalina Antigua, MD (Physician) filed at 12/18/2013  8:55 AM    Chief Complaint: Abdominal Pain    None      SUBJECTIVE HPI: Kristy Cox is a 68 y.o. G2P2002 who presents to  maternity admissions reporting LLQ pain, intermittent for a few months but mild until the last few weeks when it has increased in intensity.  The pain became severe 4-5 days ago, unrelieved by Aleve and tramadol.  She has appt later this month in Gyn clinic but came in today because the pain is too severe and she cannot wait until her appt.  Last menstrual period >30 years ago.  She denies vaginal bleeding, vaginal itching/burning, urinary symptoms, h/a, dizziness, n/v, or fever/chills.        Past Medical History   Diagnosis  Date   .  Acid reflux     .  Hypertension     .  Hypercholesteremia     .  Osteoporosis     .  Irregular heart beat          PVC's      History reviewed. No pertinent past surgical history. History       Social History   .  Marital Status:  Single       Spouse Name:  N/A       Number of Children:  N/A   .  Years of Education:  N/A       Occupational History   .  Not on file.       Social History Main Topics   .  Smoking status:  Never Smoker    .  Smokeless tobacco:  Never Used   .  Alcohol Use:  No   .  Drug Use:  No   .  Sexual Activity:  Not Currently       Other Topics  Concern   .  Not on file       Social History Narrative   .  No narrative on file       No current facility-administered medications on file prior to encounter.       Current Outpatient Prescriptions on File Prior to Encounter   Medication  Sig  Dispense  Refill   .  calcium-vitamin D (OSCAL WITH D) 500-200 MG-UNIT per tablet  Take 1 tablet by mouth daily.          Marland Kitchen  latanoprost (XALATAN) 0.005 % ophthalmic solution  Place 1 drop into both eyes at bedtime.         Marland Kitchen  losartan-hydrochlorothiazide (HYZAAR) 100-25 MG per tablet  Take 1 tablet by mouth daily.         .  naproxen sodium (ANAPROX) 220 MG tablet  Take 220 mg by mouth daily as needed (for pain).         Marland Kitchen  NIFEdipine (PROCARDIA-XL/ADALAT-CC/NIFEDICAL-XL) 30 MG 24 hr tablet  Take 30 mg by mouth daily.         .  pantoprazole (PROTONIX) 40 MG tablet  Take 40 mg by mouth 2 (two) times daily.         .  simvastatin (ZOCOR) 40 MG tablet  Take 40 mg by mouth at bedtime.             Allergies   Allergen  Reactions   .  Azithromycin  Nausea And Vomiting   .  Penicillins  Other (See Comments)       "didn't feel like myself"        ROS: Pertinent items in HPI   OBJECTIVE Blood pressure 115/92, pulse 62, temperature 98.1 F (36.7 C), temperature source Oral, resp. rate 18. GENERAL: Well-developed, well-nourished female in no acute distress.  HEENT: Normocephalic HEART: normal rate RESP: normal effort ABDOMEN: Soft, non-tender EXTREMITIES:  Nontender, no edema NEURO: Alert and oriented Pelvic exam: Cervix pink, visually closed, without lesion, scant white creamy discharge, vaginal walls and external genitalia normal Bimanual exam: Cervix 0/long/high, firm, anterior, neg CMT, uterus nontender, nonenlarged, significant tenderness in left adnexal region, none on right, no palpable enlargement or mass   LAB RESULTS No results found for this or any previous visit (from the past 24 hour(s)).   IMAGING US Transvaginal Non-ob   12/14/2013   CLINICAL DATA:  Left lower quadrant pain, history of left ovarian cyst, possible pelvic congestion on CT  EXAM: TRANSABDOMINAL AND TRANSVAGINAL ULTRASOUND OF PELVIS  TECHNIQUE: Both transabdominal and transvaginal ultrasound examinations of the pelvis were performed. Transabdominal technique was performed for global imaging of the pelvis including uterus, ovaries, adnexal regions, and pelvic cul-de-sac. It was necessary to proceed with endovaginal exam following the transabdominal exam to visualize the endometrium and right adnexa.  COMPARISON:  CT abdomen pelvis dated 11/29/2013  FINDINGS: Uterus  Measurements: 5.5 x 3.3 x 3.5 cm. Retroverted. No fibroids or other mass visualized.  Endometrium  Thickness: 2 mm. Fluid within the endometrial cavity. No focal abnormality visualized.  Right ovary  Not discretely visualized transabdominally or transvaginally.  Left ovary  Measurements: 1.0 x 0.5 x 0.6 cm. Normal appearance/no adnexal mass.  Other findings  No free fluid. Again noted is mildly prominent pelvic vasculature in the bilateral adnexa, right greater than left on [place], nonspecific and better evaluated on CT.  IMPRESSION: Fluid within the endometrial cavity. In the postmenopausal setting, this could reflect cervical stenosis.  Right ovary is not discretely visualized.   Electronically Signed   By: Charline Bills M.D.   On: 12/14/2013 16:55    US Pelvis Complete   12/14/2013   CLINICAL DATA:  Left  lower quadrant pain, history of left ovarian cyst, possible pelvic congestion on CT  EXAM: TRANSABDOMINAL AND TRANSVAGINAL ULTRASOUND OF PELVIS  TECHNIQUE: Both transabdominal and transvaginal ultrasound examinations of the pelvis were performed. Transabdominal technique was performed for global imaging of the pelvis including uterus, ovaries, adnexal regions, and pelvic cul-de-sac. It was necessary to proceed with endovaginal exam following the transabdominal exam to visualize the endometrium and right adnexa.  COMPARISON:  CT abdomen pelvis dated 11/29/2013  FINDINGS: Uterus  Measurements: 5.5 x 3.3 x 3.5 cm. Retroverted. No fibroids or other mass visualized.  Endometrium  Thickness: 2 mm. Fluid within the endometrial cavity. No focal abnormality visualized.  Right ovary  Not discretely visualized transabdominally or transvaginally.  Left ovary  Measurements: 1.0 x 0.5 x 0.6 cm. Normal appearance/no adnexal mass.  Other findings  No free fluid. Again noted is mildly prominent pelvic vasculature in the bilateral adnexa, right greater than left on [place], nonspecific and better evaluated on CT.  IMPRESSION: Fluid within the endometrial cavity. In the postmenopausal setting, this could reflect cervical stenosis.  Right ovary is not discretely visualized.   Electronically Signed   By: Charline Bills M.D.   On: 12/14/2013 16:55    Ct Abdomen Pelvis W Contrast   11/29/2013   CLINICAL DATA:  Left lower quadrant abdominal pain and left flank pain. Nausea, vomiting and diarrhea.  EXAM: CT ABDOMEN AND PELVIS WITH CONTRAST  TECHNIQUE: Multidetector CT imaging of the abdomen and pelvis was performed using the standard protocol following bolus administration of intravenous contrast.  CONTRAST:  80mL OMNIPAQUE IOHEXOL 300 MG/ML  SOLN  COMPARISON:  CT of the abdomen  and pelvis performed 10/22/2013  FINDINGS: Right basilar atelectasis is noted, with underlying bronchiectasis. Minimal left basilar atelectasis is noted.   Scattered hepatic cysts are grossly unchanged in appearance, measuring up to 3.5 cm in size. The spleen is unremarkable in appearance. The gallbladder is within normal limits. The pancreas and adrenal glands are unremarkable.  The kidneys are unremarkable in appearance. There is no evidence of hydronephrosis. No renal or ureteral stones are seen. No perinephric stranding is appreciated.  No free fluid is identified. The small bowel is unremarkable in appearance. The stomach is within normal limits. No acute vascular abnormalities are seen. Scattered calcification is seen along the abdominal aorta and its branches.  The appendix is normal in caliber and contains air, without evidence for appendicitis. Mild scattered diverticulosis is noted along the distal descending and proximal sigmoid colon, without evidence of diverticulitis. The colon is otherwise unremarkable.  The bladder is mildly distended and grossly unremarkable. The uterus is unremarkable in appearance. The ovaries are relatively symmetric. Note is made of prominence of the periuterine vasculature, extending about left ovary. This could reflect pelvic congestion syndrome in the appropriate clinical situation. No suspicious adnexal masses are seen. No inguinal lymphadenopathy is seen.  No acute osseous abnormalities are identified.  IMPRESSION: 1. No acute abnormalities seen within the abdomen or pelvis. 2. Scattered diverticulosis along the distal descending and proximal sigmoid colon, without evidence of diverticulitis. 3. Prominence of the periuterine vasculature again noted, extending about the left ovary. This could reflect pelvic congestion syndrome in the appropriate clinical situation, and can explain chronic lower abdominal pain. 4. Scattered calcification along the abdominal aorta and its branches. 5. Right basilar atelectasis noted, with underlying bronchiectasis. 6. Scattered hepatic cysts again noted.   Electronically Signed   By: Roanna Raider M.D.   On: 11/29/2013 04:59       ASSESSMENT 1.  LLQ pain    2.  Pelvic congestion syndrome         PLAN Discharge home Percocet 5/325, take 1-2 tabs Q 6 hours PRN x 20 tabs Continue Aleve Q 6 hours Keep f/u appointment with Dr Debroah Loop as scheduled Return to [facility] as needed for emergencies       Medication List      STOP taking these medications           traMADol 50 MG tablet   Commonly known as:  ULTRAM          TAKE these medications           calcium-vitamin D 500-200 MG-UNIT per tablet   Commonly known as:  OSCAL WITH D   Take 1 tablet by mouth daily.         latanoprost 0.005 % ophthalmic solution   Commonly known as:  XALATAN   Place 1 drop into both eyes at bedtime.         losartan-hydrochlorothiazide 100-25 MG per tablet   Commonly known as:  HYZAAR   Take 1 tablet by mouth daily.         naproxen sodium 220 MG tablet   Commonly known as:  ANAPROX   Take 220 mg by mouth daily as needed (for pain).         NIFEdipine 30 MG 24 hr tablet   Commonly known as:  PROCARDIA-XL/ADALAT-CC/NIFEDICAL-XL   Take 30 mg by mouth daily.         oxyCODONE-acetaminophen 5-325 MG per tablet   Commonly known as:  PERCOCET/ROXICET  Take 1-2 tablets by mouth every 6 (six) hours as needed.         pantoprazole 40 MG tablet   Commonly known as:  PROTONIX   Take 40 mg by mouth 2 (two) times daily.         simvastatin 40 MG tablet   Commonly known as:  ZOCOR   Take 40 mg by mouth at bedtime.              Follow-up Information     Follow up with [facility]. (As scheduled)      Specialty:  Obstetrics and Gynecology     Contact information:     [address] [place] [address] [phone]          Follow up with [facility] [place] [facility]ERN[facility] ADMISSIONS. (As needed for emergencies)      Contact information:     [address] [id number] [place] [address] [phone]           Sharen Counter Certified Nurse-Midwife 12/16/2013   3:49 AM     Assessment    Left side abdominal pain unclear etiology but pelvic congestion is not a diagnosis pertinent to postmenopausal patient     Plan    Return to primary care and consider GI f/u        Deshayla Empson 01/01/2014, 10:53 AM

## 2014-01-01 NOTE — Patient Instructions (Signed)
Abdominal Pain, Women °Abdominal (stomach, pelvic, or belly) pain can be caused by many things. It is important to tell your doctor: °· The location of the pain. °· Does it come and go or is it present all the time? °· Are there things that start the pain (eating certain foods, exercise)? °· Are there other symptoms associated with the pain (fever, nausea, vomiting, diarrhea)? °All of this is helpful to know when trying to find the cause of the pain. °CAUSES  °· Stomach: virus or bacteria infection, or ulcer. °· Intestine: appendicitis (inflamed appendix), regional ileitis (Crohn's disease), ulcerative colitis (inflamed colon), irritable bowel syndrome, diverticulitis (inflamed diverticulum of the colon), or cancer of the stomach or intestine. °· Gallbladder disease or stones in the gallbladder. °· Kidney disease, kidney stones, or infection. °· Pancreas infection or cancer. °· Fibromyalgia (pain disorder). °· Diseases of the female organs: °¨ Uterus: fibroid (non-cancerous) tumors or infection. °¨ Fallopian tubes: infection or tubal pregnancy. °¨ Ovary: cysts or tumors. °¨ Pelvic adhesions (scar tissue). °¨ Endometriosis (uterus lining tissue growing in the pelvis and on the pelvic organs). °¨ Pelvic congestion syndrome (female organs filling up with blood just before the menstrual period). °¨ Pain with the menstrual period. °¨ Pain with ovulation (producing an egg). °¨ Pain with an IUD (intrauterine device, birth control) in the uterus. °¨ Cancer of the female organs. °· Functional pain (pain not caused by a disease, may improve without treatment). °· Psychological pain. °· Depression. °DIAGNOSIS  °Your doctor will decide the seriousness of your pain by doing an examination. °· Blood tests. °· X-rays. °· Ultrasound. °· CT scan (computed tomography, special type of X-ray). °· MRI (magnetic resonance imaging). °· Cultures, for infection. °· Barium enema (dye inserted in the large intestine, to better view it with  X-rays). °· Colonoscopy (looking in intestine with a lighted tube). °· Laparoscopy (minor surgery, looking in abdomen with a lighted tube). °· Major abdominal exploratory surgery (looking in abdomen with a large incision). °TREATMENT  °The treatment will depend on the cause of the pain.  °· Many cases can be observed and treated at home. °· Over-the-counter medicines recommended by your caregiver. °· Prescription medicine. °· Antibiotics, for infection. °· Birth control pills, for painful periods or for ovulation pain. °· Hormone treatment, for endometriosis. °· Nerve blocking injections. °· Physical therapy. °· Antidepressants. °· Counseling with a psychologist or psychiatrist. °· Minor or major surgery. °HOME CARE INSTRUCTIONS  °· Do not take laxatives, unless directed by your caregiver. °· Take over-the-counter pain medicine only if ordered by your caregiver. Do not take aspirin because it can cause an upset stomach or bleeding. °· Try a clear liquid diet (broth or water) as ordered by your caregiver. Slowly move to a bland diet, as tolerated, if the pain is related to the stomach or intestine. °· Have a thermometer and take your temperature several times a day, and record it. °· Bed rest and sleep, if it helps the pain. °· Avoid sexual intercourse, if it causes pain. °· Avoid stressful situations. °· Keep your follow-up appointments and tests, as your caregiver orders. °· If the pain does not go away with medicine or surgery, you may try: °¨ Acupuncture. °¨ Relaxation exercises (yoga, meditation). °¨ Group therapy. °¨ Counseling. °SEEK MEDICAL CARE IF:  °· You notice certain foods cause stomach pain. °· Your home care treatment is not helping your pain. °· You need stronger pain medicine. °· You want your IUD removed. °· You feel faint or   lightheaded. °· You develop nausea and vomiting. °· You develop a rash. °· You are having side effects or an allergy to your medicine. °SEEK IMMEDIATE MEDICAL CARE IF:  °· Your  pain does not go away or gets worse. °· You have a fever. °· Your pain is felt only in portions of the abdomen. The right side could possibly be appendicitis. The left lower portion of the abdomen could be colitis or diverticulitis. °· You are passing blood in your stools (bright red or black tarry stools, with or without vomiting). °· You have blood in your urine. °· You develop chills, with or without a fever. °· You pass out. °MAKE SURE YOU:  °· Understand these instructions. °· Will watch your condition. °· Will get help right away if you are not doing well or get worse. °Document Released: 01/28/2007 Document Revised: 08/17/2013 Document Reviewed: 02/17/2009 °[employer]® Patient Information ©2015 [employer]. This information is not intended to replace advice given to you by your health care provider. Make sure you discuss any questions you have with your health care provider. ° °

## 2014-01-18 ENCOUNTER — Ambulatory Visit (INDEPENDENT_AMBULATORY_CARE_PROVIDER_SITE_OTHER): Payer: No Typology Code available for payment source | Admitting: Family Medicine

## 2014-01-18 VITALS — BP 127/75 | HR 69 | Temp 97.9°F | Ht 64.0 in | Wt 137.8 lb

## 2014-01-18 DIAGNOSIS — R109 Unspecified abdominal pain: Secondary | ICD-10-CM

## 2014-01-18 DIAGNOSIS — I10 Essential (primary) hypertension: Secondary | ICD-10-CM

## 2014-01-18 DIAGNOSIS — Z23 Encounter for immunization: Secondary | ICD-10-CM

## 2014-01-18 NOTE — Progress Notes (Signed)
Patient ID: Kristy Cox, female   DOB: 01/05/1946, 68 y.o.   MRN: [id number]   HPI  Patient presents today for followup abdominal pain and hypertension  Hypertension No chest pain, dyspnea, palpitations, leg edema Taking meds  Abdominal pain Has continued to have abdominal pain intermittently, however it's much improved about 2 months ago. Last episode was 10 over 10 pain lasted for approximately one week. She denies fever, chills, sweats, decreased by mouth intake. She had GYN referral without any major findings She is colonoscopy EGD at [facility] in February of this year with no major findings  He is to have 3-4 loose stools daily and for the last 2 weeks she has had only one stool per Cox with difficulty stooling and straining. She denies blood or black stools.  She had a pneumonia shot, Pneumovax, in February 2013   Smoking status noted ROS: Per HPI  Objective: BP 127/75  Pulse 69  Temp(Src) 97.9 F (36.6 C) (Oral)  Ht 5\' 4"  (1.626 m)  Wt 137 lb 12.8 oz (62.506 kg)  BMI 23.64 kg/m2 Gen: NAD, alert, cooperative with exam HEENT: NCAT CV: RRR, good S1/S2, no murmur Resp: CTABL, no wheezes, non-labored Abd: Soft, mild tenderness to palpation of left lower quadrant, no guarding, positive bowel sounds Ext: No edema, warm Neuro: Alert and oriented, No gross deficits  Assessment and plan:  Abdominal pain in female Bruits, still intermittent. Question diverticulitis, however this was not approved by the CT scan in the ER Has had GYN referral with no major findings Has had negative colonoscopy, EGD, now CT scan with only diverticulosis Consider followup at [facility] For now work on constipation with adding Benefiber for 2 weeks, add MiraLAX one capful per Cox and titrate to one stool per Cox after that Followup 6 weeks  HTN (hypertension) Controlled No red flags Continue losartan, HCTZ, nifedipine Continue statin

## 2014-01-18 NOTE — Assessment & Plan Note (Signed)
Controlled No red flags Continue losartan, HCTZ, nifedipine Continue statin

## 2014-01-18 NOTE — Patient Instructions (Signed)
Great to see you today!  Try these things for your bowel habits:  Benefiber or metamucil 1 serving per day X 1 week then 1 twice daily  If no improvement try miralax 1 cap per day and increase (1 cap twice daily) or decrease (1 cap everyother day) for a goal of one easy stool per day  PLease come back in 6 weeks  PLease come back right away if you develop fever, chills, inability to tolerate oral food or fluid, or if your pain is not controlled at home.

## 2014-01-18 NOTE — Assessment & Plan Note (Signed)
Bruits, still intermittent. Question diverticulitis, however this was not approved by the CT scan in the ER Has had GYN referral with no major findings Has had negative colonoscopy, EGD, now CT scan with only diverticulosis Consider followup at [facility] For now work on constipation with adding Benefiber for 2 weeks, add MiraLAX one capful per day and titrate to one stool per day after that Followup 6 weeks

## 2014-01-29 ENCOUNTER — Other Ambulatory Visit: Payer: Self-pay | Admitting: *Deleted

## 2014-01-29 MED ORDER — PANTOPRAZOLE SODIUM 40 MG PO TBEC: 40.0000 mg | DELAYED_RELEASE_TABLET | Freq: Two times a day (BID) | ORAL | Status: DC

## 2014-01-29 NOTE — Telephone Encounter (Signed)
Covering for Dr. Ermalinda MemosBradshaw. Refilled Protonix. --CMS

## 2014-02-12 ENCOUNTER — Other Ambulatory Visit: Payer: Self-pay | Admitting: *Deleted

## 2014-02-15 ENCOUNTER — Emergency Department (HOSPITAL_COMMUNITY)
Admission: EM | Admit: 2014-02-15 | Discharge: 2014-02-15 | Disposition: A | Discharge status: Home / Self Care | Payer: No Typology Code available for payment source | Source: Home / Self Care | Attending: Emergency Medicine | Admitting: Emergency Medicine

## 2014-02-15 ENCOUNTER — Encounter (HOSPITAL_COMMUNITY): Payer: Self-pay | Admitting: *Deleted

## 2014-02-15 DIAGNOSIS — M5441 Lumbago with sciatica, right side: Secondary | ICD-10-CM

## 2014-02-15 MED ORDER — NAPROXEN 500 MG PO TABS
500.0000 mg | ORAL_TABLET | Freq: Two times a day (BID) | ORAL | Status: DC
Start: 2014-02-15 — End: 2015-09-24

## 2014-02-15 MED ORDER — KETOROLAC TROMETHAMINE 60 MG/2ML IM SOLN
INTRAMUSCULAR | Status: AC
  Filled 2014-02-15: qty 2

## 2014-02-15 MED ORDER — KETOROLAC TROMETHAMINE 60 MG/2ML IM SOLN
60.0000 mg | Freq: Once | INTRAMUSCULAR | Status: AC
  Administered 2014-02-15: 60 mg via INTRAMUSCULAR

## 2014-02-15 MED ORDER — METHYLPREDNISOLONE ACETATE PF 80 MG/ML IJ SUSP
80.0000 mg | Freq: Once | INTRAMUSCULAR | Status: AC
  Administered 2014-02-15: 80 mg via INTRAMUSCULAR

## 2014-02-15 MED ORDER — METHYLPREDNISOLONE ACETATE 80 MG/ML IJ SUSP
INTRAMUSCULAR | Status: AC
  Filled 2014-02-15: qty 1

## 2014-02-15 NOTE — ED Notes (Signed)
Pt  Reports      Reports       symptoms     og f  Low backpain x  1  Week     aggrevated  sev  Days  Ago by work     -pt  Reports the  Pain is for  The most part  Constant in nature    She     denys  Any  Urinary  Symptoms

## 2014-02-15 NOTE — Discharge Instructions (Signed)
Back Pain, Adult °Back pain is very common. The pain often gets better over time. The cause of back pain is usually not dangerous. Most people can learn to manage their back pain on their own.  °HOME CARE  °· Stay active. Start with short walks on flat ground if you can. Try to walk farther each day. °· Do not sit, drive, or stand in one place for more than 30 minutes. Do not stay in bed. °· Do not avoid exercise or work. Activity can help your back heal faster. °· Be careful when you bend or lift an object. Bend at your knees, keep the object close to you, and do not twist. °· Sleep on a firm mattress. Lie on your side, and bend your knees. If you lie on your back, put a pillow under your knees. °· Only take medicines as told by your doctor. °· Put ice on the injured area. °¨ Put ice in a plastic bag. °¨ Place a towel between your skin and the bag. °¨ Leave the ice on for 15-20 minutes, 03-04 times a day for the first 2 to 3 days. After that, you can switch between ice and heat packs. °· Ask your doctor about back exercises or massage. °· Avoid feeling anxious or stressed. Find good ways to deal with stress, such as exercise. °GET HELP RIGHT AWAY IF:  °· Your pain does not go away with rest or medicine. °· Your pain does not go away in 1 week. °· You have new problems. °· You do not feel well. °· The pain spreads into your legs. °· You cannot control when you poop (bowel movement) or pee (urinate). °· Your arms or legs feel weak or lose feeling (numbness). °· You feel sick to your stomach (nauseous) or throw up (vomit). °· You have belly (abdominal) pain. °· You feel like you may pass out (faint). °MAKE SURE YOU:  °· Understand these instructions. °· Will watch your condition. °· Will get help right away if you are not doing well or get worse. °Document Released: 09/19/2007 Document Revised: 06/25/2011 Document Reviewed: 08/04/2013 °[employer]® Patient Information ©2015 [employer]. This information is not intended  to replace advice given to you by your health care provider. Make sure you discuss any questions you have with your health care provider. ° °

## 2014-02-15 NOTE — ED Provider Notes (Signed)
CSN: [id number]     Arrival date & time 02/15/14  0800 History   First MD Initiated Contact with Patient 02/15/14 [id number]     Chief Complaint  Patient presents with  . Back Pain   (Consider location/radiation/quality/duration/timing/severity/associated sxs/prior Treatment) HPI      68 year old female presents for evaluation of mid back pain. The pain has been going on for 1 week. This happened after she strained herself. The pain has been getting slightly better with Aleve. She gets occasional pain that radiates down her right leg. No extremity numbness or weakness. No loss of bowel or bladder control.no significant history of back pain.  Past Medical History  Diagnosis Date  . Acid reflux   . Hypertension   . Hypercholesteremia   . Osteoporosis   . Irregular heart beat     PVC's   History reviewed. No pertinent past surgical history. Family History  Problem Relation Age of Onset  . Breast cancer Mother   . Lung cancer Sister   . Coronary artery disease     History  Substance Use Topics  . Smoking status: Never Smoker   . Smokeless tobacco: Never Used  . Alcohol Use: No   OB History    Gravida Para Term Preterm AB TAB SAB Ectopic Multiple Living   2 2 2       2      Review of Systems  Musculoskeletal: Positive for back pain.       Right leg pain   All other systems reviewed and are negative.   Allergies  Azithromycin and Penicillins  Home Medications   Prior to Admission medications   Medication Sig Start Date End Date Taking? Authorizing Provider  calcium-vitamin D (OSCAL WITH D) 500-200 MG-UNIT per tablet Take 1 tablet by mouth daily.     Historical Provider, MD  latanoprost (XALATAN) 0.005 % ophthalmic solution Place 1 drop into both eyes at bedtime.    Historical Provider, MD  losartan-hydrochlorothiazide (HYZAAR) 100-25 MG per tablet Take 1 tablet by mouth daily.    Historical Provider, MD  naproxen (NAPROSYN) 500 MG tablet Take 1 tablet (500 mg total) by mouth 2  (two) times daily. 02/15/14   Adrian BlackwaterZachary H Marcheta Horsey, PA-C  naproxen sodium (ANAPROX) 220 MG tablet Take 220 mg by mouth daily as needed (for pain).    Historical Provider, MD  NIFEdipine (PROCARDIA-XL/ADALAT-CC/NIFEDICAL-XL) 30 MG 24 hr tablet Take 30 mg by mouth daily.    Historical Provider, MD  oxyCODONE-acetaminophen (PERCOCET/ROXICET) 5-325 MG per tablet Take 1-2 tablets by mouth every 6 (six) hours as needed. 12/14/13   Lisa A Leftwich-Kirby, CNM  pantoprazole (PROTONIX) 40 MG tablet Take 1 tablet (40 mg total) by mouth 2 (two) times daily. 01/29/14   Stephanie Couphristopher M Street, MD  simvastatin (ZOCOR) 40 MG tablet Take 40 mg by mouth at bedtime.    Historical Provider, MD   BP 125/73 mmHg  Pulse 61  Temp(Src) 99.1 F (37.3 C) (Oral)  Resp 16  SpO2 99% Physical Exam  Constitutional: She is oriented to person, place, and time. Vital signs are normal. She appears well-developed and well-nourished. No distress.  HENT:  Head: Normocephalic and atraumatic.  Cardiovascular: Normal rate, regular rhythm and normal heart sounds.   Pulmonary/Chest: Effort normal and breath sounds normal. No respiratory distress.  Musculoskeletal:       Thoracic back: Normal.       Lumbar back: She exhibits tenderness (tenderness across the superior portion of the lumbar spine, with no focal  bony tenderness). She exhibits normal range of motion, no bony tenderness and no pain.  Neurological: She is alert and oriented to person, place, and time. She has normal reflexes. No sensory deficit. She exhibits normal muscle tone. She displays a negative Romberg sign. Coordination and gait normal.  Strength 5/5 in the left lower extremity, 4/5 in the right lower extremity, but she is able to overcome this with effort  Skin: Skin is warm and dry. No rash noted. She is not diaphoretic.  Psychiatric: She has a normal mood and affect. Judgment normal.  Nursing note and vitals reviewed.   ED Course  Procedures (including critical care  time) Labs Review Labs Reviewed - No data to display  Imaging Review No results found.   MDM   1. Midline low back pain with right-sided sciatica    Treat symptomatically for low back pain. I sent it message to her primary care provider to follow-up on the possible strength deficit, may need MRI in the future if this continues. The fact that she could overcome this deficit with effort most likely means that the apparent strength deficit was voluntary, secondary to pain   Meds ordered this encounter  Medications  . ketorolac (TORADOL) injection 60 mg    Sig:   . methylPREDNISolone acetate PF (DEPO-MEDROL) injection 80 mg    Sig:   . naproxen (NAPROSYN) 500 MG tablet    Sig: Take 1 tablet (500 mg total) by mouth 2 (two) times daily.    Dispense:  30 tablet    Refill:  0    Order Specific Question:  Supervising Provider    Answer:  Lorenz CoasterKELLER, DAVID C [[id number]]       Graylon GoodZachary H Daisa Stennis, PA-C 02/15/14 1205

## 2014-02-16 MED ORDER — LATANOPROST 0.005 % OP SOLN: 1.0000 [drp] | Freq: Every day | OPHTHALMIC | Status: DC

## 2014-02-18 ENCOUNTER — Ambulatory Visit: Payer: No Typology Code available for payment source

## 2014-02-24 ENCOUNTER — Encounter: Payer: Self-pay | Admitting: Family Medicine

## 2014-02-24 ENCOUNTER — Ambulatory Visit (INDEPENDENT_AMBULATORY_CARE_PROVIDER_SITE_OTHER): Payer: Self-pay | Admitting: Family Medicine

## 2014-02-24 VITALS — BP 128/64 | HR 60 | Temp 98.1°F | Ht 64.0 in | Wt 137.9 lb

## 2014-02-24 DIAGNOSIS — I10 Essential (primary) hypertension: Secondary | ICD-10-CM

## 2014-02-24 DIAGNOSIS — M549 Dorsalgia, unspecified: Secondary | ICD-10-CM | POA: Insufficient documentation

## 2014-02-24 DIAGNOSIS — M546 Pain in thoracic spine: Secondary | ICD-10-CM

## 2014-02-24 DIAGNOSIS — R109 Unspecified abdominal pain: Secondary | ICD-10-CM

## 2014-02-24 DIAGNOSIS — D649 Anemia, unspecified: Secondary | ICD-10-CM

## 2014-02-24 MED ORDER — SIMVASTATIN 40 MG PO TABS: 40.0000 mg | ORAL_TABLET | Freq: Every day | ORAL | Status: DC

## 2014-02-24 MED ORDER — LATANOPROST 0.005 % OP SOLN: 1.0000 [drp] | Freq: Every day | OPHTHALMIC | Status: AC

## 2014-02-24 MED ORDER — LOSARTAN POTASSIUM-HCTZ 100-25 MG PO TABS: 1.0000 | ORAL_TABLET | Freq: Every day | ORAL | Status: DC

## 2014-02-24 MED ORDER — PANTOPRAZOLE SODIUM 40 MG PO TBEC: 40.0000 mg | DELAYED_RELEASE_TABLET | Freq: Two times a day (BID) | ORAL | Status: DC

## 2014-02-24 MED ORDER — NIFEDIPINE ER OSMOTIC RELEASE 30 MG PO TB24: 30.0000 mg | ORAL_TABLET | Freq: Every day | ORAL | Status: DC

## 2014-02-24 NOTE — Assessment & Plan Note (Signed)
Resolved, decrease Protonix to once daily

## 2014-02-24 NOTE — Assessment & Plan Note (Signed)
Well controlled Continue Hyzaar and nifedipine

## 2014-02-24 NOTE — Assessment & Plan Note (Signed)
Acute onset, resolving Most likely strained muscle versus spasm Continued intermittent NSAIDs, encouraged ice versus heat. The red flags to indicate need for MRI

## 2014-02-24 NOTE — Addendum Note (Signed)
Addended by: Elenora GammaBRADSHAW, SAMUEL L on: 02/24/2014 05:18 PM   Modules accepted: Orders

## 2014-02-24 NOTE — Patient Instructions (Signed)
Great to see you today!  I will let you know about your results  Lets follow up in about 6 months unless you need something sooner

## 2014-02-24 NOTE — Assessment & Plan Note (Addendum)
Normocytic anemia, Asymptomatic No source of bleeding with the recent colonoscopy that was normal Check B-12, ferritin, folate, smear

## 2014-02-24 NOTE — Progress Notes (Addendum)
Patient ID: Kristy Cox, female   DOB: 10-21-1945, 68 y.o.   MRN: [id number]   HPI  Patient presents today for follow-up for abdominal pain  Abdominal pain Resolved with Protonix, normal bowel function.  Back pain Happened after she was making her bed about 3 weeks ago with an acute pain in her right mid lateral back after lifting a mattress to tuck the sheet under. She states that the pain continued on for a couple weeks and she went to urgent care last Sunday. They put her on NSAIDs and recommended ice. She has had improvement of the pain since then. She denies any arm weakness, leg weakness, bowel or bladder dysfunction, or saddle anesthesia. She states the pain is written down helped by Aleve which she doesn't take everyday.  Hypertension Taking meds, needs refills No chest pain, dyspnea, palpitations, leg edema  Smoking status noted ROS: Per HPI  Objective: BP 128/64 mmHg  Pulse 60  Temp(Src) 98.1 F (36.7 C) (Oral)  Ht 5\' 4"  (1.626 m)  Wt 137 lb 14.4 oz (62.551 kg)  BMI 23.66 kg/m2 Gen: NAD, alert, cooperative with exam HEENT: NCAT, EOMI, PERRL CV: RRR, good S1/S2, no murmur Resp: CTABL, no wheezes, non-labored Ext: No edema, warm Neuro: Alert and oriented, No gross deficits MSK: Paraspinal tenderness in the left thoracic area with no spinal tenderness. Mild tenderness to palpation  Assessment and plan:  Normocytic anemia Normocytic anemia, Asymptomatic No source of bleeding with the recent colonoscopy that was normal Check B-12, ferritin, folate, hemoglobin electrophoresis    HTN (hypertension) Well controlled Continue Hyzaar and nifedipine  Abdominal pain in female Resolved, decrease Protonix to once daily  Back pain Acute onset, resolving Most likely strained muscle versus spasm Continued intermittent NSAIDs, encouraged ice versus heat. The red flags to indicate need for MRI    Orders Placed This Encounter  Procedures  . Vitamin B12  . Folate   . Ferritin  . Pathologist smear review    Meds ordered this encounter  Medications  . latanoprost (XALATAN) 0.005 % ophthalmic solution    Sig: Place 1 drop into both eyes at bedtime.    Dispense:  2.5 mL    Refill:  11  . losartan-hydrochlorothiazide (HYZAAR) 100-25 MG per tablet    Sig: Take 1 tablet by mouth daily.    Dispense:  90 tablet    Refill:  3  . pantoprazole (PROTONIX) 40 MG tablet    Sig: Take 1 tablet (40 mg total) by mouth 2 (two) times daily.    Dispense:  90 tablet    Refill:  3  . simvastatin (ZOCOR) 40 MG tablet    Sig: Take 1 tablet (40 mg total) by mouth at bedtime.    Dispense:  90 tablet    Refill:  3  . NIFEdipine (PROCARDIA-XL/ADALAT-CC/NIFEDICAL-XL) 30 MG 24 hr tablet    Sig: Take 1 tablet (30 mg total) by mouth daily.    Dispense:  90 tablet    Refill:  3

## 2014-02-25 LAB — VITAMIN B12: Vitamin B-12: 826 pg/mL (ref 211–911)

## 2014-02-25 LAB — FOLATE: Folate: >20 ng/mL

## 2014-02-25 LAB — FERRITIN: FERRITIN: 57 ng/mL (ref 10–291)

## 2014-02-25 LAB — PATHOLOGIST SMEAR REVIEW

## 2014-02-26 ENCOUNTER — Telehealth: Payer: Self-pay | Admitting: Family Medicine

## 2014-02-26 NOTE — Telephone Encounter (Signed)
Recent labs unrevealing for source of anemia. POssibly anemia of chronic disease. Although which disease is not obvious.   Will ask staff to inform.  Murtis SinkSam Aryona Sill, MD [facility] Resident, PGY-3 02/26/2014, 11:03 AM

## 2014-03-01 NOTE — Telephone Encounter (Signed)
Pt informed. Kristy Cox CMA 

## 2014-03-02 ENCOUNTER — Encounter: Payer: Self-pay | Admitting: Sports Medicine

## 2014-03-02 ENCOUNTER — Ambulatory Visit (INDEPENDENT_AMBULATORY_CARE_PROVIDER_SITE_OTHER): Payer: Self-pay | Admitting: Sports Medicine

## 2014-03-02 VITALS — BP 127/74 | HR 71 | Ht 64.0 in | Wt 137.0 lb

## 2014-03-02 DIAGNOSIS — M25579 Pain in unspecified ankle and joints of unspecified foot: Secondary | ICD-10-CM

## 2014-03-02 NOTE — Progress Notes (Signed)
   Subjective:    Patient ID: Kristy Cox, female    DOB: 06-16-1945, 68 y.o.   MRN: [id number]  HPI chief complaint: Bilateral foot pain  Patient comes in today complaining of bilateral foot pain. In regards to the left foot she is complaining of pain in the metatarsal head region. Some numbness in this area as well but no radiating numbness or tingling into her toes. She is also complaining of some arch pain in the right foot. She had custom orthotics designed back in May 2014. These were fitted with bilateral metatarsal pads and a 3/16 inch heel lift on the left. She has found the orthotics to be comfortable. Current bilateral foot pain has been present for a few weeks. No known trauma. She has not noticed any swelling.    Review of Systems     Objective:   Physical Exam Well-developed, well-nourished. No acute distress. Awake alert and oriented 3. Vital signs reviewed.  Left foot: Tenderness to palpation across the second through fourth metatarsal heads. Negative mudlers sign. No pain with metatarsal squeeze. Collapse of the transverse arch and longitudinal arch with standing.  Right foot: Tenderness to palpation along the mid substance of the plantar fascia. No palpable nodules. No tenderness to palpation at the calcaneal insertion of the plantar fascia. Transverse arch collapse and longitudinal arch collapse with standing.  Patient walks without a limp.       Assessment & Plan:  Bilateral foot pain with evidence of longitudinal arch collapse and transverse arch collapse Leg length discrepancy  In evaluating her current orthotics it appears as though the metatarsal pads and 3/16 inch heel lift are quite worn. I will simply replace these pads for her and I will give her an arch strap to try on her right foot. Hopefully these simple corrections will result in symptom resolution. If not, patient will return to the office for further evaluation.

## 2014-11-15 ENCOUNTER — Other Ambulatory Visit (HOSPITAL_COMMUNITY): Payer: Self-pay | Admitting: Family Medicine

## 2014-11-15 DIAGNOSIS — Z1231 Encounter for screening mammogram for malignant neoplasm of breast: Secondary | ICD-10-CM

## 2014-12-17 ENCOUNTER — Ambulatory Visit (HOSPITAL_COMMUNITY)
Admission: RE | Admit: 2014-12-17 | Discharge: 2014-12-17 | Disposition: A | Discharge status: Home / Self Care | Payer: Medicare Other | Source: Ambulatory Visit | Attending: Family Medicine | Admitting: Family Medicine

## 2014-12-17 DIAGNOSIS — Z1231 Encounter for screening mammogram for malignant neoplasm of breast: Secondary | ICD-10-CM | POA: Insufficient documentation

## 2015-03-27 IMAGING — US US PELVIS COMPLETE
1 series · 13 of 25 positions shown · non-contrast
Comparison: CT abdomen pelvis dated 11/29/2013

CLINICAL DATA: Left lower quadrant pain, history of left ovarian
cyst, possible pelvic congestion on CT

EXAM:
TRANSABDOMINAL AND TRANSVAGINAL ULTRASOUND OF PELVIS
TECHNIQUE: Both transabdominal and transvaginal ultrasound examinations of the
pelvis were performed. Transabdominal technique was performed for
global imaging of the pelvis including uterus, ovaries, adnexal
regions, and pelvic cul-de-sac. It was necessary to proceed with
endovaginal exam following the transabdominal exam to visualize the
endometrium and right adnexa.

[Series 1: us pelvis complete · 51 acquisitions, 13 frames shown]
[im 1/51]
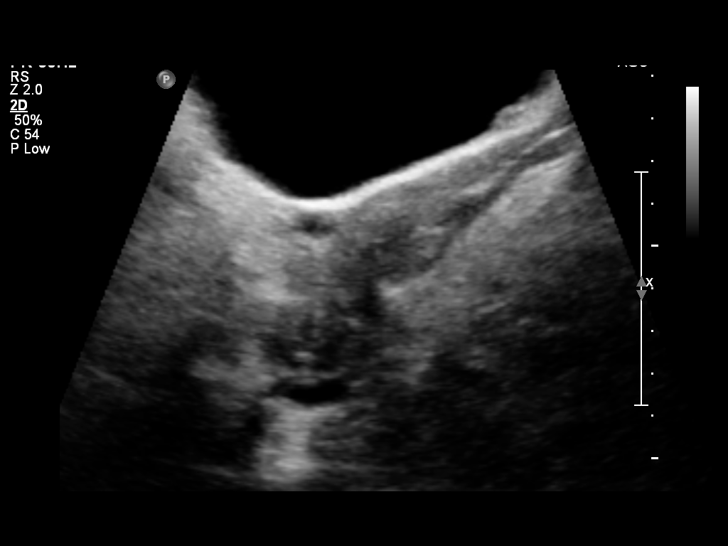
[im 5/51]
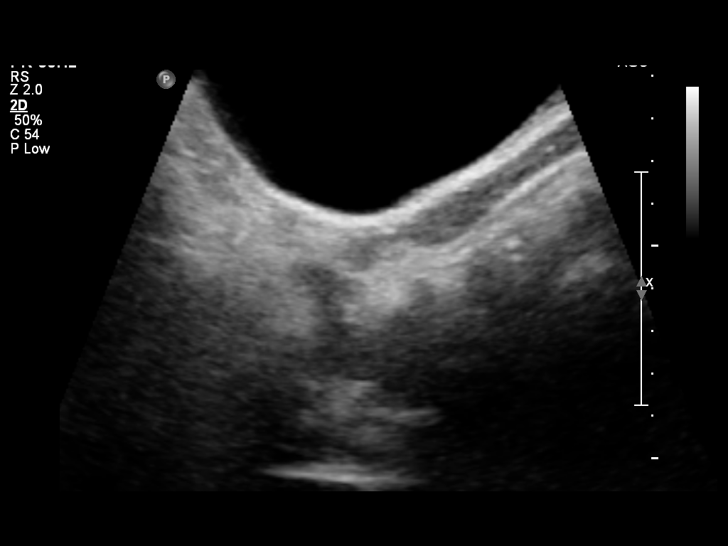
[im 9/51]
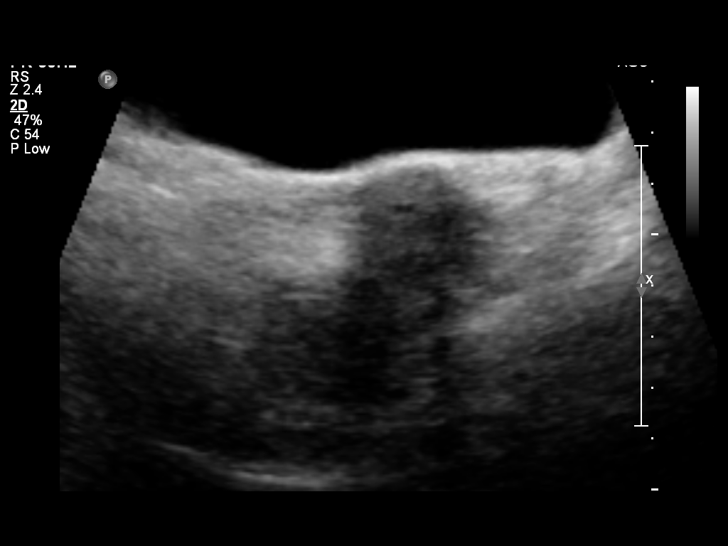
[im 13/51]
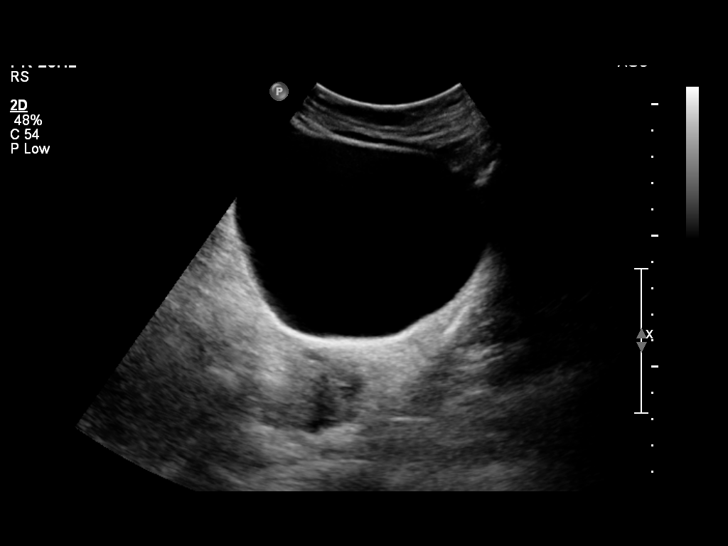
[im 17/51]
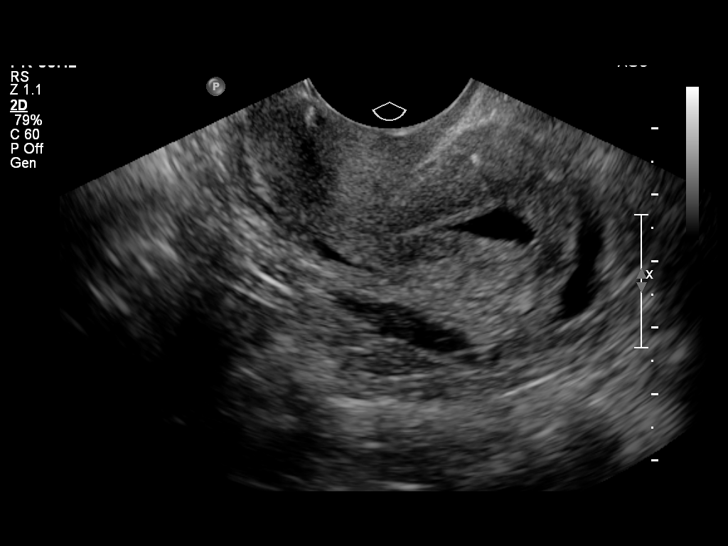
[im 21/51]
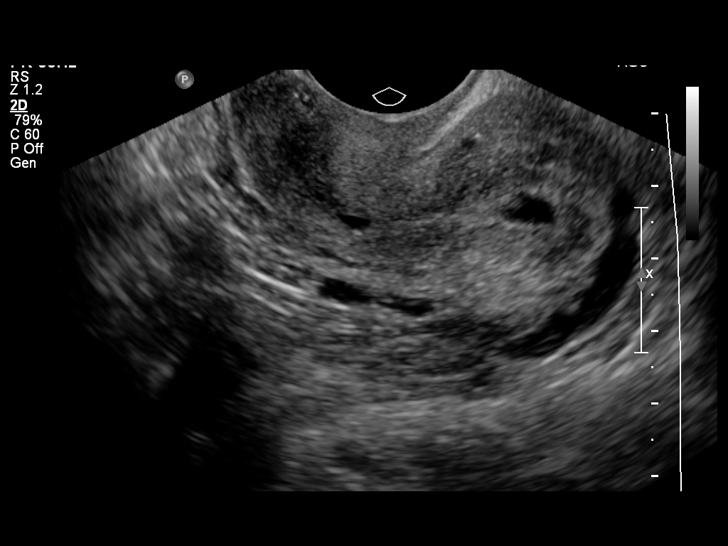
[im 26/51]
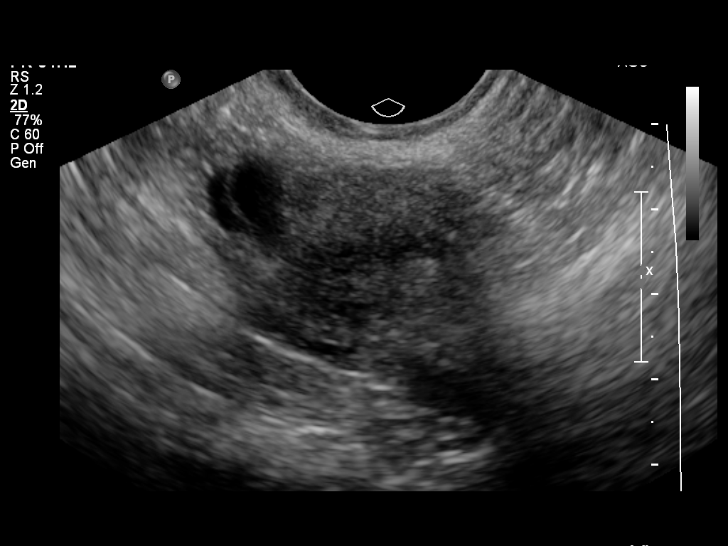
[im 30/51]
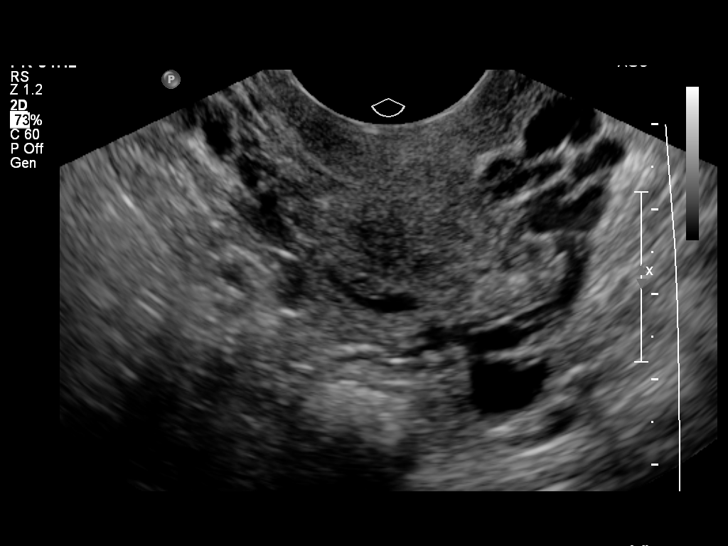
[im 34/51]
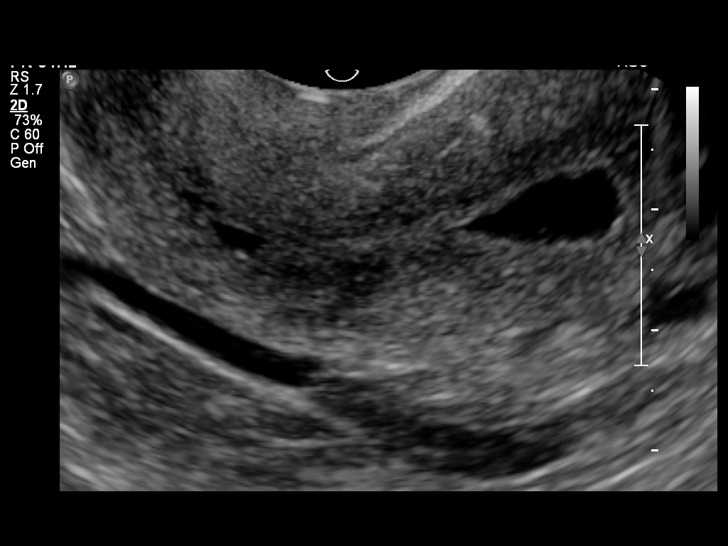
[im 38/51]
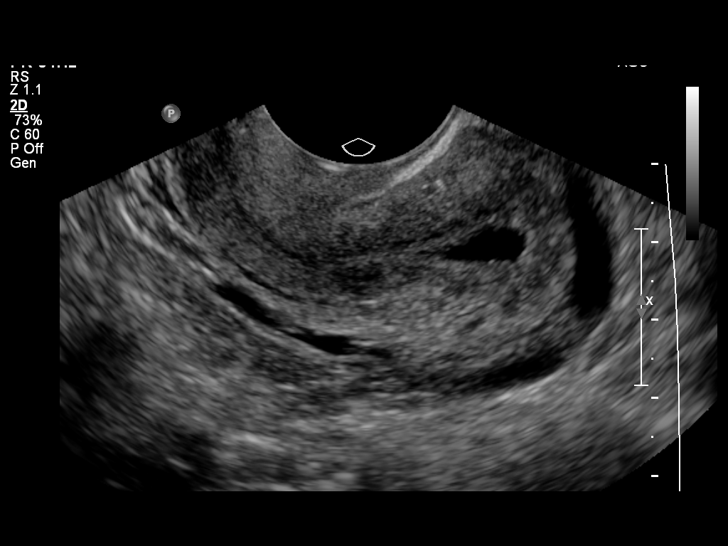
[im 42/51]
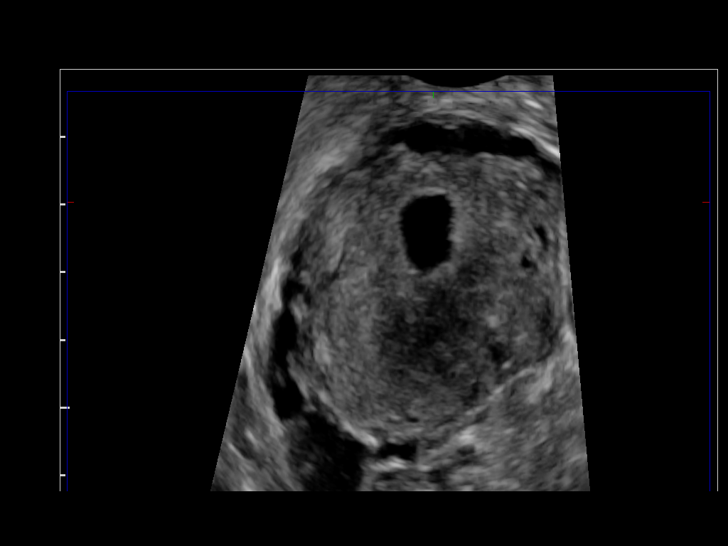
[im 46/51]
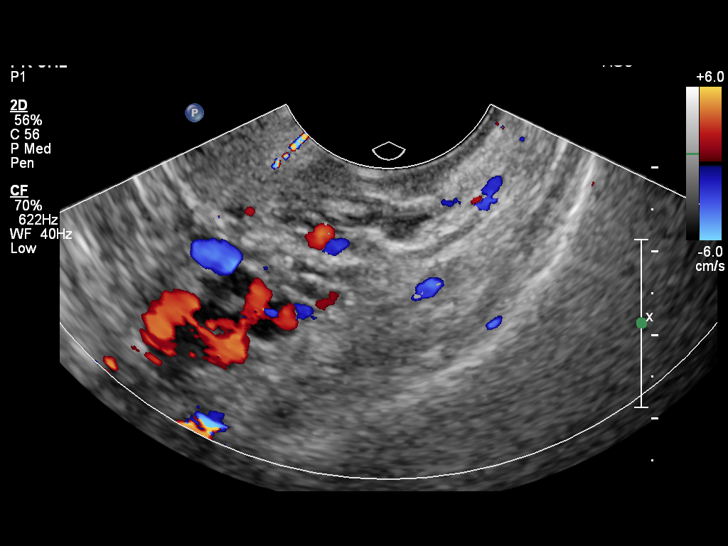
[im 51/51]
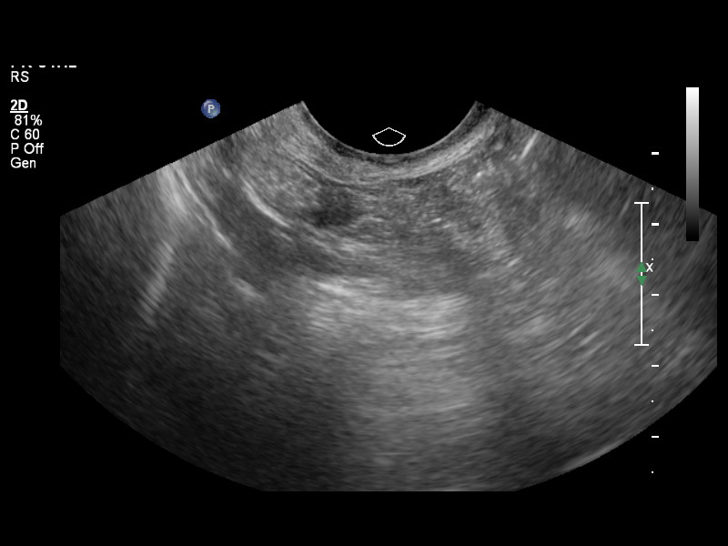

[13 of 25 positions shown; findings below may reference images not displayed]

FINDINGS: Uterus

Measurements: 5.5 x 3.3 x 3.5 cm. Retroverted. No fibroids or other
mass visualized.

Endometrium

Thickness: 2 mm. Fluid within the endometrial cavity. No focal
abnormality visualized.

Right ovary

Not discretely visualized transabdominally or transvaginally.

Left ovary

Measurements: 1.0 x 0.5 x 0.6 cm.. Normal appearance/no adnexal
mass.

Other findings

No free fluid. Again noted is mildly prominent pelvic vasculature in
the bilateral adnexa, right greater than left on US, nonspecific and
better evaluated on CT.
IMPRESSION: Fluid within the endometrial cavity. In the postmenopausal setting,
this could reflect cervical stenosis.

Right ovary is not discretely visualized.

## 2015-09-24 ENCOUNTER — Emergency Department (HOSPITAL_COMMUNITY)
Admission: EM | Admit: 2015-09-24 | Discharge: 2015-09-25 | Disposition: A | Discharge status: Home / Self Care | Payer: BLUE CROSS/BLUE SHIELD | Attending: Emergency Medicine | Admitting: Emergency Medicine

## 2015-09-24 ENCOUNTER — Encounter (HOSPITAL_COMMUNITY): Payer: Self-pay

## 2015-09-24 DIAGNOSIS — I10 Essential (primary) hypertension: Secondary | ICD-10-CM | POA: Insufficient documentation

## 2015-09-24 DIAGNOSIS — K7689 Other specified diseases of liver: Secondary | ICD-10-CM | POA: Insufficient documentation

## 2015-09-24 DIAGNOSIS — Z79899 Other long term (current) drug therapy: Secondary | ICD-10-CM | POA: Diagnosis not present

## 2015-09-24 DIAGNOSIS — K5730 Diverticulosis of large intestine without perforation or abscess without bleeding: Secondary | ICD-10-CM | POA: Insufficient documentation

## 2015-09-24 DIAGNOSIS — R1084 Generalized abdominal pain: Secondary | ICD-10-CM | POA: Diagnosis present

## 2015-09-24 DIAGNOSIS — M810 Age-related osteoporosis without current pathological fracture: Secondary | ICD-10-CM | POA: Insufficient documentation

## 2015-09-24 LAB — COMPREHENSIVE METABOLIC PANEL
ALBUMIN: 3.9 g/dL (ref 3.5–5.0)
ALT: 13 U/L — ABNORMAL LOW (ref 14–54)
AST: 22 U/L (ref 15–41)
Alkaline Phosphatase: 48 U/L (ref 38–126)
Anion gap: 9 (ref 5–15)
BUN: 22 mg/dL — AB (ref 6–20)
CHLORIDE: 104 mmol/L (ref 101–111)
CO2: 24 mmol/L (ref 22–32)
Calcium: 10.3 mg/dL (ref 8.9–10.3)
Creatinine, Ser: 1.03 mg/dL — ABNORMAL HIGH (ref 0.44–1.00)
GFR calc Af Amer: >60 mL/min (ref >60)
GFR calc non Af Amer: 54 mL/min — ABNORMAL LOW (ref >60)
Glucose, Bld: 143 mg/dL — ABNORMAL HIGH (ref 65–99)
POTASSIUM: 3.1 mmol/L — AB (ref 3.5–5.1)
SODIUM: 137 mmol/L (ref 135–145)
Total Bilirubin: 0.4 mg/dL (ref 0.3–1.2)
Total Protein: 7.1 g/dL (ref 6.5–8.1)

## 2015-09-24 LAB — CBC
HEMATOCRIT: 34.4 % — AB (ref 36.0–46.0)
Hemoglobin: 10.7 g/dL — ABNORMAL LOW (ref 12.0–15.0)
MCH: 28.8 pg (ref 26.0–34.0)
MCHC: 31.1 g/dL (ref 30.0–36.0)
MCV: 92.5 fL (ref 78.0–100.0)
PLATELETS: 265 10*3/uL (ref 150–400)
RBC: 3.72 MIL/uL — ABNORMAL LOW (ref 3.87–5.11)
RDW: 14.3 % (ref 11.5–15.5)
WBC: 8.8 10*3/uL (ref 4.0–10.5)

## 2015-09-24 LAB — URINALYSIS, ROUTINE W REFLEX MICROSCOPIC
Bilirubin Urine: NEGATIVE
GLUCOSE, UA: NEGATIVE mg/dL
HGB URINE DIPSTICK: NEGATIVE
Ketones, ur: NEGATIVE mg/dL
LEUKOCYTES UA: NEGATIVE
Nitrite: NEGATIVE
PH: 6.5 (ref 5.0–8.0)
PROTEIN: NEGATIVE mg/dL
Specific Gravity, Urine: 1.013 (ref 1.005–1.030)

## 2015-09-24 LAB — LIPASE, BLOOD: LIPASE: 24 U/L (ref 11–51)

## 2015-09-24 MED ORDER — IOPAMIDOL (ISOVUE-300) INJECTION 61%
INTRAVENOUS | Status: AC
  Administered 2015-09-25: 100 mL
  Filled 2015-09-24: qty 100

## 2015-09-24 NOTE — ED Notes (Signed)
EDP at bedside  

## 2015-09-24 NOTE — ED Notes (Signed)
Pt here with RUQ abdominal pain, onset 3 weeks ago. She saw PCP last week and prescribed prednisone. She also reports nausea and diarrhea.

## 2015-09-24 NOTE — ED Provider Notes (Signed)
CSN: [id number]     Arrival date & time 09/24/15  1716 History   First MD Initiated Contact with Patient 09/24/15 2124     Chief Complaint  Patient presents with  . Abdominal Pain     (Consider location/radiation/quality/duration/timing/severity/associated sxs/prior Treatment) HPI   Started mos ago, for the last 3 weeks it has been acting up more, sometimes it is worse than others, right upper quadrant pain, feels like a heavine3wss and tightness in right side and radiates to rest of stomach.  Stomach pain for 3 weeks, diffuse pain.  Last thursday is when right sided pain started.  No chest pain.  Thursday right upper quadrant pain, walking, coughing, sneezing makes it hurt more.  Not worse after eating, no special time.  Diarrhea, for 7 days 3-5 times per day, on and off over the past 3 weeks. Feels nausea, sometimes dizzy.  1 time emesis.  Salt on the tongue helps Pain 10/10 in whole stomach   Past Medical History  Diagnosis Date  . Acid reflux   . Hypertension   . Hypercholesteremia   . Osteoporosis   . Irregular heart beat     PVC's   History reviewed. No pertinent past surgical history. Family History  Problem Relation Age of Onset  . Breast cancer Mother   . Lung cancer Sister   . Coronary artery disease     Social History  Substance Use Topics  . Smoking status: Never Smoker   . Smokeless tobacco: Never Used  . Alcohol Use: No   OB History    Gravida Para Term Preterm AB TAB SAB Ectopic Multiple Living   2 2 2       2      Review of Systems  Constitutional: Negative for fever.  HENT: Negative for sore throat.   Eyes: Negative for visual disturbance.  Respiratory: Negative for cough and shortness of breath.   Cardiovascular: Negative for chest pain.  Gastrointestinal: Positive for nausea, abdominal pain and diarrhea. Negative for vomiting (one episode), constipation and blood in stool.  Genitourinary: Negative for dysuria and difficulty urinating.   Musculoskeletal: Negative for back pain and neck pain.  Skin: Negative for rash.  Neurological: Negative for syncope and headaches.      Allergies  Azithromycin and Penicillins  Home Medications   Prior to Admission medications   Medication Sig Start Date End Date Taking? Authorizing Provider  calcium-vitamin D (OSCAL WITH D) 500-200 MG-UNIT per tablet Take 1 tablet by mouth daily.    Yes Historical Provider, MD  latanoprost (XALATAN) 0.005 % ophthalmic solution Place 1 drop into both eyes at bedtime. 02/24/14  Yes Elenora Gamma, MD  losartan-hydrochlorothiazide (HYZAAR) 100-25 MG per tablet Take 1 tablet by mouth daily. 02/24/14  Yes Elenora Gamma, MD  naproxen sodium (ANAPROX) 220 MG tablet Take 220 mg by mouth daily as needed (for pain).   Yes Historical Provider, MD  NIFEDICAL XL 30 MG 24 hr tablet Take 30 mg by mouth daily. 09/17/15  Yes Historical Provider, MD  pantoprazole (PROTONIX) 40 MG tablet Take 1 tablet (40 mg total) by mouth 2 (two) times daily. 02/24/14  Yes Elenora Gamma, MD  predniSONE (STERAPRED UNI-PAK 21 TAB) 10 MG (21) TBPK tablet Take by mouth taper from 4 doses each day to 1 dose and stop. 09/22/15  Yes Historical Provider, MD  simvastatin (ZOCOR) 40 MG tablet Take 1 tablet (40 mg total) by mouth at bedtime. 02/24/14  Yes Elenora Gamma, MD  cyclobenzaprine (FLEXERIL) 10 MG tablet Take 1 tablet (10 mg total) by mouth 2 (two) times daily as needed for muscle spasms. 09/25/15   Alvira Monday, MD  dicyclomine (BENTYL) 20 MG tablet Take 1 tablet (20 mg total) by mouth 2 (two) times daily. 09/25/15   Alvira Monday, MD   BP 145/93 mmHg  Pulse 59  Temp(Src) 98.4 F (36.9 C) (Oral)  Resp 18  SpO2 95% Physical Exam  Constitutional: She is oriented to person, place, and time. She appears well-developed and well-nourished. No distress.  HENT:  Head: Normocephalic and atraumatic.  Eyes: Conjunctivae and EOM are normal.  Neck: Normal range of motion.   Cardiovascular: Normal rate, regular rhythm, normal heart sounds and intact distal pulses.  Exam reveals no gallop and no friction rub.   No murmur heard. Pulmonary/Chest: Effort normal and breath sounds normal. No respiratory distress. She has no wheezes. She has no rales.  Abdominal: Soft. She exhibits distension. There is tenderness (diffuse, worse in RLQ and RUQ) in the right upper quadrant and right lower quadrant. There is positive Murphy's sign. There is no guarding.  Musculoskeletal: She exhibits no edema or tenderness.  Neurological: She is alert and oriented to person, place, and time.  Skin: Skin is warm and dry. No rash noted. She is not diaphoretic. No erythema.  Nursing note and vitals reviewed.   ED Course  Procedures (including critical care time) Labs Review Labs Reviewed  COMPREHENSIVE METABOLIC PANEL - Abnormal; Notable for the following:    Potassium 3.1 (*)    Glucose, Bld 143 (*)    BUN 22 (*)    Creatinine, Ser 1.03 (*)    ALT 13 (*)    GFR calc non Af Amer 54 (*)    All other components within normal limits  CBC - Abnormal; Notable for the following:    RBC 3.72 (*)    Hemoglobin 10.7 (*)    HCT 34.4 (*)    All other components within normal limits  LIPASE, BLOOD  URINALYSIS, ROUTINE W REFLEX MICROSCOPIC (NOT AT [facility])    Imaging Review Ct Abdomen Pelvis W Contrast  09/25/2015  CLINICAL DATA:  Subacute onset of right upper quadrant abdominal pain and distention. Diarrhea. Initial encounter. EXAM: CT ABDOMEN AND PELVIS WITH CONTRAST TECHNIQUE: Multidetector CT imaging of the abdomen and pelvis was performed using the standard protocol following bolus administration of intravenous contrast. CONTRAST:  ISOVUE-300 IOPAMIDOL (ISOVUE-300) INJECTION 61% COMPARISON:  CT of the abdomen and pelvis performed 11/29/2013, and MRI of the lumbar spine performed 10/25/2014 FINDINGS: Right basilar atelectasis is noted. Scattered hepatic cysts are noted, measuring up to  3.3 cm in size. The spleen is unremarkable in appearance. The gallbladder is within normal limits. The pancreas and adrenal glands are unremarkable. The kidneys are unremarkable in appearance. There is no evidence of hydronephrosis. No renal or ureteral stones are seen. No perinephric stranding is appreciated. No free fluid is identified. The small bowel is unremarkable in appearance. The stomach is within normal limits. No acute vascular abnormalities are seen. The appendix is normal in caliber, without evidence of appendicitis. Scattered diverticulosis is noted along the descending and sigmoid colon, without evidence of diverticulitis. The bladder is mildly distended and grossly unremarkable. Uterine fibroids are suggested. No suspicious adnexal masses are seen. The ovaries are relatively symmetric. No inguinal lymphadenopathy is seen. No acute osseous abnormalities are identified. IMPRESSION: 1. No acute abnormality seen within the abdomen or pelvis. 2. Scattered hepatic cysts noted. 3. Scattered  diverticulosis along the descending and sigmoid colon, without evidence of diverticulitis. 4. Suggestion of uterine fibroids. 5. Right basilar atelectasis noted. Electronically Signed   By: Roanna RaiderJeffery  Chang M.D.   On: 09/25/2015 00:39   I have personally reviewed and evaluated these images and lab results as part of my medical decision-making.   EKG Interpretation None      MDM   Final diagnoses:  Generalized abdominal pain  Diverticulosis of large intestine without hemorrhage  Hepatic cyst   70 year old female with a history of hypertension, hypercholesterolemia, abdominal pain, gastritis and H. Pylori presents with concern for abdominal pain for 3 weeks.  Patient with diffuse abdominal tenderness on exam, however its worse than the right lower quadrant, and right upper quadrant. Given the there is tenderness, CT abdomen pelvis was ordered which showed no acute abnormalities. Patient's urinalysis does not  show UTI. Her labs are within normal limits. Given patient has tenderness on exam, has no chest pain, have low suspicion for cardiac cause of symptoms, and low suspicion for pulmonary embolus. She does have tenderness over the ribs on the right side, pain with movement, coughing, as well as right upper quadrant tenderness, and the symptoms may be secondary to musculoskeletal pain. There is no sign of any gallbladder pathology on CT, and no laboratory abnormalities, and hx does not seem consistent with biliary colic.  Discussed with pt unclear etiology of abd pain, intermittent diarrhea, however recommend close PCP follow up and possible GI follow up. Given prescription for Flexeril for muscular pain, Bentyl for abdominal pain. Patient discharged in stable condition with understanding of reasons to return.   Alvira MondayErin Celestine Bougie, MD 09/25/15 [phone]

## 2015-09-25 ENCOUNTER — Emergency Department (HOSPITAL_COMMUNITY): Payer: BLUE CROSS/BLUE SHIELD

## 2015-09-25 MED ORDER — DICYCLOMINE HCL 20 MG PO TABS: 20.0000 mg | ORAL_TABLET | Freq: Two times a day (BID) | ORAL | Status: DC

## 2015-09-25 MED ORDER — CYCLOBENZAPRINE HCL 10 MG PO TABS: 10.0000 mg | ORAL_TABLET | Freq: Two times a day (BID) | ORAL | Status: DC | PRN

## 2015-09-25 MED ORDER — DICYCLOMINE HCL 10 MG/ML IM SOLN
20.0000 mg | Freq: Once | INTRAMUSCULAR | Status: AC
  Administered 2015-09-25: 20 mg via INTRAMUSCULAR
  Filled 2015-09-25: qty 2

## 2015-09-25 MED ORDER — POTASSIUM CHLORIDE CRYS ER 20 MEQ PO TBCR
40.0000 meq | EXTENDED_RELEASE_TABLET | Freq: Once | ORAL | Status: AC
  Administered 2015-09-25: 40 meq via ORAL
  Filled 2015-09-25: qty 2

## 2015-09-25 NOTE — Discharge Instructions (Signed)

## 2015-10-25 ENCOUNTER — Other Ambulatory Visit: Payer: Self-pay | Admitting: Gastroenterology

## 2015-10-25 DIAGNOSIS — R1010 Upper abdominal pain, unspecified: Secondary | ICD-10-CM

## 2015-10-31 ENCOUNTER — Other Ambulatory Visit: Payer: Medicare Other

## 2015-11-02 ENCOUNTER — Ambulatory Visit
Admission: RE | Admit: 2015-11-02 | Discharge: 2015-11-02 | Disposition: A | Discharge status: Home / Self Care | Payer: BLUE CROSS/BLUE SHIELD | Source: Ambulatory Visit | Attending: Gastroenterology | Admitting: Gastroenterology

## 2015-11-02 DIAGNOSIS — R1010 Upper abdominal pain, unspecified: Secondary | ICD-10-CM

## 2015-11-29 ENCOUNTER — Other Ambulatory Visit: Payer: Self-pay | Admitting: Family Medicine

## 2015-11-29 DIAGNOSIS — Z1231 Encounter for screening mammogram for malignant neoplasm of breast: Secondary | ICD-10-CM

## 2015-12-08 ENCOUNTER — Other Ambulatory Visit (HOSPITAL_COMMUNITY): Payer: Self-pay | Admitting: Gastroenterology

## 2015-12-08 DIAGNOSIS — R109 Unspecified abdominal pain: Secondary | ICD-10-CM

## 2015-12-21 ENCOUNTER — Ambulatory Visit
Admission: RE | Admit: 2015-12-21 | Discharge: 2015-12-21 | Disposition: A | Discharge status: Home / Self Care | Payer: BLUE CROSS/BLUE SHIELD | Source: Ambulatory Visit | Attending: Family Medicine | Admitting: Family Medicine

## 2015-12-21 DIAGNOSIS — Z1231 Encounter for screening mammogram for malignant neoplasm of breast: Secondary | ICD-10-CM

## 2015-12-22 ENCOUNTER — Encounter (HOSPITAL_COMMUNITY)
Admission: RE | Admit: 2015-12-22 | Discharge: 2015-12-22 | Disposition: A | Discharge status: Home / Self Care | Payer: BLUE CROSS/BLUE SHIELD | Source: Ambulatory Visit | Attending: Gastroenterology | Admitting: Gastroenterology

## 2015-12-22 DIAGNOSIS — R109 Unspecified abdominal pain: Secondary | ICD-10-CM | POA: Diagnosis present

## 2015-12-22 MED ORDER — TECHNETIUM TC 99M MEBROFENIN IV KIT
5.0000 | PACK | Freq: Once | INTRAVENOUS | Status: AC | PRN
  Administered 2015-12-22: 5 via INTRAVENOUS

## 2016-05-25 ENCOUNTER — Telehealth: Payer: Self-pay | Admitting: Family Medicine

## 2016-05-25 NOTE — Telephone Encounter (Signed)
[facility]. Has new private PCP.

## 2016-06-12 ENCOUNTER — Other Ambulatory Visit: Payer: Self-pay | Admitting: Gastroenterology

## 2016-06-12 DIAGNOSIS — R1010 Upper abdominal pain, unspecified: Secondary | ICD-10-CM

## 2016-06-22 ENCOUNTER — Ambulatory Visit
Admission: RE | Admit: 2016-06-22 | Discharge: 2016-06-22 | Disposition: A | Discharge status: Home / Self Care | Payer: BLUE CROSS/BLUE SHIELD | Source: Ambulatory Visit | Attending: Gastroenterology | Admitting: Gastroenterology

## 2016-06-22 DIAGNOSIS — R1010 Upper abdominal pain, unspecified: Secondary | ICD-10-CM

## 2016-11-06 ENCOUNTER — Other Ambulatory Visit: Payer: Self-pay | Admitting: Family Medicine

## 2016-11-06 DIAGNOSIS — Z1239 Encounter for other screening for malignant neoplasm of breast: Secondary | ICD-10-CM

## 2016-12-21 ENCOUNTER — Ambulatory Visit: Payer: BLUE CROSS/BLUE SHIELD

## 2017-01-10 ENCOUNTER — Ambulatory Visit
Admission: RE | Admit: 2017-01-10 | Discharge: 2017-01-10 | Disposition: A | Discharge status: Home / Self Care | Payer: BLUE CROSS/BLUE SHIELD | Source: Ambulatory Visit | Attending: Family Medicine | Admitting: Family Medicine

## 2017-01-10 DIAGNOSIS — Z1239 Encounter for other screening for malignant neoplasm of breast: Secondary | ICD-10-CM

## 2017-12-02 ENCOUNTER — Other Ambulatory Visit: Payer: Self-pay | Admitting: Family Medicine

## 2017-12-02 DIAGNOSIS — Z1231 Encounter for screening mammogram for malignant neoplasm of breast: Secondary | ICD-10-CM

## 2018-01-13 ENCOUNTER — Ambulatory Visit
Admission: RE | Admit: 2018-01-13 | Discharge: 2018-01-13 | Disposition: A | Discharge status: Home / Self Care | Payer: BLUE CROSS/BLUE SHIELD | Source: Ambulatory Visit | Attending: Family Medicine | Admitting: Family Medicine

## 2018-01-13 DIAGNOSIS — Z1231 Encounter for screening mammogram for malignant neoplasm of breast: Secondary | ICD-10-CM

## 2018-09-20 ENCOUNTER — Other Ambulatory Visit: Payer: Self-pay

## 2018-09-20 ENCOUNTER — Emergency Department (HOSPITAL_COMMUNITY)
Admission: EM | Admit: 2018-09-20 | Discharge: 2018-09-20 | Disposition: A | Discharge status: Home / Self Care | Payer: BLUE CROSS/BLUE SHIELD | Attending: Emergency Medicine | Admitting: Emergency Medicine

## 2018-09-20 ENCOUNTER — Emergency Department (HOSPITAL_COMMUNITY): Payer: BLUE CROSS/BLUE SHIELD

## 2018-09-20 ENCOUNTER — Encounter (HOSPITAL_COMMUNITY): Payer: Self-pay

## 2018-09-20 DIAGNOSIS — R509 Fever, unspecified: Secondary | ICD-10-CM | POA: Diagnosis present

## 2018-09-20 DIAGNOSIS — Z79899 Other long term (current) drug therapy: Secondary | ICD-10-CM | POA: Insufficient documentation

## 2018-09-20 DIAGNOSIS — N390 Urinary tract infection, site not specified: Secondary | ICD-10-CM | POA: Insufficient documentation

## 2018-09-20 DIAGNOSIS — Z03818 Encounter for observation for suspected exposure to other biological agents ruled out: Secondary | ICD-10-CM | POA: Diagnosis not present

## 2018-09-20 DIAGNOSIS — I10 Essential (primary) hypertension: Secondary | ICD-10-CM | POA: Diagnosis not present

## 2018-09-20 DIAGNOSIS — J189 Pneumonia, unspecified organism: Secondary | ICD-10-CM | POA: Insufficient documentation

## 2018-09-20 LAB — COMPREHENSIVE METABOLIC PANEL
ALT: 15 U/L (ref 0–44)
AST: 43 U/L — ABNORMAL HIGH (ref 15–41)
Albumin: 3.8 g/dL (ref 3.5–5.0)
Alkaline Phosphatase: 74 U/L (ref 38–126)
Anion gap: 15 (ref 5–15)
BUN: 15 mg/dL (ref 8–23)
CO2: 24 mmol/L (ref 22–32)
Calcium: 8.8 mg/dL — ABNORMAL LOW (ref 8.9–10.3)
Chloride: 100 mmol/L (ref 98–111)
Creatinine, Ser: 1.2 mg/dL — ABNORMAL HIGH (ref 0.44–1.00)
GFR calc Af Amer: 52 mL/min — ABNORMAL LOW (ref >60)
GFR calc non Af Amer: 45 mL/min — ABNORMAL LOW (ref >60)
Glucose, Bld: 135 mg/dL — ABNORMAL HIGH (ref 70–99)
Potassium: 4.7 mmol/L (ref 3.5–5.1)
Sodium: 139 mmol/L (ref 135–145)
Total Bilirubin: 1.2 mg/dL (ref 0.3–1.2)
Total Protein: 7.5 g/dL (ref 6.5–8.1)

## 2018-09-20 LAB — CBC WITH DIFFERENTIAL/PLATELET
Abs Immature Granulocytes: 0 10*3/uL (ref 0.00–0.07)
Basophils Absolute: 0 10*3/uL (ref 0.0–0.1)
Basophils Relative: 0 %
Eosinophils Absolute: 0 10*3/uL (ref 0.0–0.5)
Eosinophils Relative: 0 %
HCT: 31.2 % — ABNORMAL LOW (ref 36.0–46.0)
Hemoglobin: 10.2 g/dL — ABNORMAL LOW (ref 12.0–15.0)
Lymphocytes Relative: 18 %
Lymphs Abs: 1 10*3/uL (ref 0.7–4.0)
MCH: 30.4 pg (ref 26.0–34.0)
MCHC: 32.7 g/dL (ref 30.0–36.0)
MCV: 92.9 fL (ref 80.0–100.0)
Monocytes Absolute: 0.5 10*3/uL (ref 0.1–1.0)
Monocytes Relative: 9 %
Neutro Abs: 4 10*3/uL (ref 1.7–7.7)
Neutrophils Relative %: 73 %
Platelets: 192 10*3/uL (ref 150–400)
RBC: 3.36 MIL/uL — ABNORMAL LOW (ref 3.87–5.11)
RDW: 14.6 % (ref 11.5–15.5)
WBC: 5.5 10*3/uL (ref 4.0–10.5)
nRBC: 0 % (ref 0.0–0.2)
nRBC: 0 /100 WBC

## 2018-09-20 LAB — URINALYSIS, ROUTINE W REFLEX MICROSCOPIC
Bilirubin Urine: NEGATIVE
Glucose, UA: NEGATIVE mg/dL
Ketones, ur: NEGATIVE mg/dL
Nitrite: NEGATIVE
Protein, ur: 30 mg/dL — AB
Specific Gravity, Urine: 1.026 (ref 1.005–1.030)
WBC, UA: >50 WBC/hpf — ABNORMAL HIGH (ref 0–5)
pH: 7 (ref 5.0–8.0)

## 2018-09-20 LAB — SARS CORONAVIRUS 2 BY RT PCR (HOSPITAL ORDER, PERFORMED IN ~~LOC~~ HOSPITAL LAB): SARS Coronavirus 2: NEGATIVE

## 2018-09-20 LAB — LACTIC ACID, PLASMA: Lactic Acid, Venous: 1.9 mmol/L (ref 0.5–1.9)

## 2018-09-20 MED ORDER — LACTATED RINGERS IV BOLUS
1000.0000 mL | Freq: Once | INTRAVENOUS | Status: AC
  Administered 2018-09-20: 1000 mL via INTRAVENOUS

## 2018-09-20 MED ORDER — LEVOFLOXACIN 750 MG PO TABS: 750.0000 mg | ORAL_TABLET | Freq: Every day | ORAL | 0 refills | Status: AC

## 2018-09-20 MED ORDER — ACETAMINOPHEN 500 MG PO TABS
1000.0000 mg | ORAL_TABLET | Freq: Once | ORAL | Status: AC
  Administered 2018-09-20: 1000 mg via ORAL
  Filled 2018-09-20: qty 2

## 2018-09-20 MED ORDER — IOHEXOL 350 MG/ML SOLN
75.0000 mL | Freq: Once | INTRAVENOUS | Status: AC | PRN
  Administered 2018-09-20: 75 mL via INTRAVENOUS

## 2018-09-20 MED ORDER — LEVOFLOXACIN 750 MG PO TABS
750.0000 mg | ORAL_TABLET | Freq: Once | ORAL | Status: AC
  Administered 2018-09-20: 750 mg via ORAL
  Filled 2018-09-20: qty 1

## 2018-09-20 NOTE — ED Notes (Signed)
Patient verbalizes understanding of discharge instructions. Opportunity for questioning and answers were provided. Armband removed by staff, pt discharged from ED in wheelchair. Son waiting outside for pt. dropped off to sons car in round a bout. Pt son Kristy Cox also informed of d/c papers

## 2018-09-20 NOTE — ED Triage Notes (Addendum)
Pt came with c/o fever & diarrhea x4 days. Denies SOB, endorses vomiting, temp is 101.4 orally upon arrival.

## 2018-09-20 NOTE — ED Provider Notes (Signed)
[facility] EMERGENCY DEPARTMENT Provider Note   CSN: [id number] Arrival date & time: 09/20/18  7510    History   Chief Complaint Chief Complaint  Patient presents with  . Fever  . Diarrhea    HPI Kristy Cox is a 73 y.o. female.     The history is provided by the patient.  Fever  Max temp prior to arrival:  102 Temp source:  Oral Severity:  Mild Onset quality:  Gradual Duration:  5 days Timing:  Intermittent Progression:  Waxing and waning Chronicity:  New Relieved by:  Acetaminophen Worsened by:  Nothing Associated symptoms: chills, congestion and diarrhea   Associated symptoms: no chest pain, no cough, no dysuria, no ear pain, no headaches, no myalgias, no nausea, no rash, no rhinorrhea, no somnolence, no sore throat and no vomiting   Risk factors: no sick contacts     Past Medical History:  Diagnosis Date  . Acid reflux   . Hypercholesteremia   . Hypertension   . Irregular heart beat    PVC's  . Osteoporosis     Patient Active Problem List   Diagnosis Date Noted  . Normocytic anemia 02/24/2014  . Back pain 02/24/2014  . Abdominal pain in female 01/01/2014  . Diverticula of colon 01/01/2014  . Nausea with vomiting 10/19/2013  . Epigastric pain 10/31/2012  . Metatarsalgia of left foot 12/26/2011  . Foot pain 12/25/2011  . Foot pain, left 12/19/2011  . Dizziness 09/09/2011  . HTN (hypertension) 04/18/2011  . HLD (hyperlipidemia) 04/18/2011  . Osteoporosis 04/18/2011  . PVC's (premature ventricular contractions) 04/18/2011  . Acid reflux 04/18/2011    History reviewed. No pertinent surgical history.   OB History    Gravida  2   Para  2   Term  2   Preterm      AB      Living  2     SAB      TAB      Ectopic      Multiple      Live Births               Home Medications    Prior to Admission medications   Medication Sig Start Date End Date Taking? Authorizing Provider  acetaminophen (TYLENOL) 500  MG tablet Take 1,000 mg by mouth every 6 (six) hours as needed.   Yes [provider]  COLLAGEN PO Take 1 tablet by mouth daily.   Yes [provider]  latanoprost (XALATAN) 0.005 % ophthalmic solution Place 1 drop into both eyes at bedtime. 02/24/14  Yes Timmothy Euler, MD  losartan (COZAAR) 100 MG tablet Take 100 mg by mouth daily. 11/29/17  Yes [provider]  NIFEDICAL XL 30 MG 24 hr tablet Take 30 mg by mouth daily. 09/17/15  Yes [provider]  pantoprazole (PROTONIX) 40 MG tablet Take 1 tablet (40 mg total) by mouth 2 (two) times daily. Patient taking differently: Take 40 mg by mouth daily before breakfast.  02/24/14  Yes Timmothy Euler, MD  simvastatin (ZOCOR) 40 MG tablet Take 1 tablet (40 mg total) by mouth at bedtime. 02/24/14  Yes Timmothy Euler, MD  cyclobenzaprine (FLEXERIL) 10 MG tablet Take 1 tablet (10 mg total) by mouth 2 (two) times daily as needed for muscle spasms. Patient not taking: Reported on 09/20/2018 09/25/15   Gareth Morgan, MD  dicyclomine (BENTYL) 20 MG tablet Take 1 tablet (20 mg total) by mouth 2 (two)  times daily. Patient not taking: Reported on 09/20/2018 09/25/15   Alvira MondaySchlossman, Erin, MD  levofloxacin (LEVAQUIN) 750 MG tablet Take 1 tablet (750 mg total) by mouth daily for 9 days. 09/21/18 09/30/18  Ric Rosenberg, DO  losartan-hydrochlorothiazide (HYZAAR) 100-25 MG per tablet Take 1 tablet by mouth daily. Patient not taking: Reported on 09/20/2018 02/24/14   Elenora GammaBradshaw, Samuel L, MD    Family History Family History  Problem Relation Age of Onset  . Breast cancer Mother   . Lung cancer Sister   . Coronary artery disease Other     Social History Social History   Tobacco Use  . Smoking status: Never Smoker  . Smokeless tobacco: Never Used  Substance Use Topics  . Alcohol use: No  . Drug use: No     Allergies   Azithromycin; Codeine; Penicillins; and Prednisone   Review of Systems Review of Systems   Constitutional: Positive for chills and fever.  HENT: Positive for congestion. Negative for ear pain, rhinorrhea and sore throat.   Eyes: Negative for pain and visual disturbance.  Respiratory: Negative for cough and shortness of breath.   Cardiovascular: Negative for chest pain and palpitations.  Gastrointestinal: Positive for diarrhea. Negative for abdominal pain, nausea and vomiting.  Genitourinary: Negative for dysuria and hematuria.  Musculoskeletal: Negative for arthralgias, back pain and myalgias.  Skin: Negative for color change and rash.  Neurological: Negative for seizures, syncope and headaches.  All other systems reviewed and are negative.    Physical Exam Updated Vital Signs  ED Triage Vitals  Enc Vitals Group     BP 09/20/18 0905 (!) 155/102     Pulse Rate 09/20/18 0905 75     Resp 09/20/18 0905 18     Temp 09/20/18 0854 (!) 101.4 F (38.6 C)     Temp Source 09/20/18 0854 Oral     SpO2 09/20/18 0905 100 %     Weight --      Height --      Head Circumference --      Peak Flow --      Pain Score --      Pain Loc --      Pain Edu? --      Excl. in GC? --     Physical Exam Vitals signs and nursing note reviewed.  Constitutional:      General: She is not in acute distress.    Appearance: She is well-developed. She is not ill-appearing.  HENT:     Head: Normocephalic and atraumatic.     Nose: Nose normal.     Mouth/Throat:     Mouth: Mucous membranes are moist.  Eyes:     Extraocular Movements: Extraocular movements intact.     Conjunctiva/sclera: Conjunctivae normal.     Pupils: Pupils are equal, round, and reactive to light.  Neck:     Musculoskeletal: Normal range of motion and neck supple.  Cardiovascular:     Rate and Rhythm: Normal rate and regular rhythm.     Pulses: Normal pulses.     Heart sounds: Normal heart sounds. No murmur.  Pulmonary:     Effort: Pulmonary effort is normal. No respiratory distress.     Breath sounds: Normal breath  sounds.  Abdominal:     General: There is no distension.     Palpations: Abdomen is soft.     Tenderness: There is no abdominal tenderness.  Musculoskeletal: Normal range of motion.  Skin:    General: Skin is warm  and dry.     Capillary Refill: Capillary refill takes less than 2 seconds.  Neurological:     General: No focal deficit present.     Mental Status: She is alert and oriented to person, place, and time.     Cranial Nerves: No cranial nerve deficit.     Sensory: No sensory deficit.     Motor: No weakness.  Psychiatric:        Mood and Affect: Mood normal.      ED Treatments / Results  Labs (all labs ordered are listed, but only abnormal results are displayed) Labs Reviewed  COMPREHENSIVE METABOLIC PANEL - Abnormal; Notable for the following components:      Result Value   Glucose, Bld 135 (*)    Creatinine, Ser 1.20 (*)    Calcium 8.8 (*)    AST 43 (*)    GFR calc non Af Amer 45 (*)    GFR calc Af Amer 52 (*)    All other components within normal limits  URINALYSIS, ROUTINE W REFLEX MICROSCOPIC - Abnormal; Notable for the following components:   APPearance HAZY (*)    Hgb urine dipstick SMALL (*)    Protein, ur 30 (*)    Leukocytes,Ua MODERATE (*)    WBC, UA >50 (*)    Bacteria, UA MANY (*)    All other components within normal limits  CBC WITH DIFFERENTIAL/PLATELET - Abnormal; Notable for the following components:   RBC 3.36 (*)    Hemoglobin 10.2 (*)    HCT 31.2 (*)    All other components within normal limits  SARS CORONAVIRUS 2 (HOSPITAL ORDER, PERFORMED IN [facility])  CULTURE, BLOOD (ROUTINE X 2)  CULTURE, BLOOD (ROUTINE X 2)  URINE CULTURE  LACTIC ACID, PLASMA  CBC WITH DIFFERENTIAL/PLATELET    EKG EKG Interpretation  Date/Time:  Saturday September 20 2018 09:04:05 EDT Ventricular Rate:  81 PR Interval:    QRS Duration: 91 QT Interval:  381 QTC Calculation: 443 R Axis:   -28 Text Interpretation:  Sinus rhythm Borderline left  axis deviation Borderline T abnormalities, anterior leads Confirmed by Virgina NorfolkAdam, Tosca Pletz [phone]([id number]) on 09/20/2018 10:12:39 AM Also confirmed by Virgina NorfolkAdam, Melaney Tellefsen [phone]), editor Barbette Hairassel, Kerry [phone])  on 09/20/2018 1:09:17 PM   Radiology Ct Angio Chest Pe W And/or Wo Contrast  Result Date: 09/20/2018 CLINICAL DATA:  Shortness of breath EXAM: CT ANGIOGRAPHY CHEST WITH CONTRAST TECHNIQUE: Multidetector CT imaging of the chest was performed using the standard protocol during bolus administration of intravenous contrast. Multiplanar CT image reconstructions and MIPs were obtained to evaluate the vascular anatomy. CONTRAST:  75mL OMNIPAQUE IOHEXOL 350 MG/ML SOLN COMPARISON:  Chest radiograph September 20, 2018; CT abdomen and pelvis September 25, 2015. Chest radiograph April 25, 2012 FINDINGS: Cardiovascular: There is no demonstrable pulmonary embolus. There is no appreciable thoracic aortic aneurysm or dissection. The visualized great vessels appear unremarkable. The right innominate and left common carotid arteries arise as a common trunk, an anatomic variant. There is no pericardial effusion or pericardial thickening. There is left ventricular hypertrophy. Note that there are foci of aortic atherosclerosis. Mediastinum/Nodes: Thyroid appears unremarkable. There is no demonstrable thoracic adenopathy. There is a small hiatal hernia. Lungs/Pleura: There is airspace consolidation in the posterior and lateral segments of the right lower lobe consistent with pneumonia. There is a small associated pleural effusion on the right. There is atelectatic change in left base and lateral right mid lung regions. Upper Abdomen: There are multiple cysts throughout the  visualized liver, similar to prior CT. Largest cyst is on the right posteriorly measuring 3.4 x 3.1 cm. There is upper abdominal aortic atherosclerosis. Musculoskeletal: There is thoracic dextroscoliosis. There is anterior wedging of the T6 vertebral body. There are no blastic or lytic  bone lesions. No chest wall lesions are evident. Review of the MIP images confirms the above findings. IMPRESSION: 1. No demonstrable pulmonary embolus. No thoracic aortic aneurysm or dissection. There are foci of aortic atherosclerosis. There is left ventricular hypertrophy. 2. Airspace consolidation consistent with pneumonia involving portions of the posterior and lateral segments of the right lower lobe. Small right pleural effusion. Areas of atelectatic change in the left base and right middle lobe laterally. 3.  No appreciable thoracic adenopathy. 4.  Small hiatal hernia. 5.  Chronic anterior wedging of the T6 vertebral body. Aortic Atherosclerosis (ICD10-I70.0). Electronically Signed   By: Bretta Bang III M.D.   On: 09/20/2018 11:24   Dg Chest Portable 1 View  Result Date: 09/20/2018 CLINICAL DATA:  Patient with fever and diarrhea EXAM: PORTABLE CHEST 1 VIEW COMPARISON:  Chest radiograph 04/25/2012 FINDINGS: Multiple monitoring leads overlie the patient. Stable cardiomegaly. Masslike area of consolidation right lower hemithorax. No pleural effusion or pneumothorax. IMPRESSION: Masslike area of consolidation within the right lower hemithorax may represent infection or pulmonary malignancy. Given the appearance on chest radiograph, recommend further evaluation with chest CT. Electronically Signed   By: Annia Belt M.D.   On: 09/20/2018 10:00    Procedures Procedures (including critical care time)  Medications Ordered in ED Medications  lactated ringers bolus 1,000 mL (0 mLs Intravenous Stopped 09/20/18 1024)  acetaminophen (TYLENOL) tablet 1,000 mg (1,000 mg Oral Given 09/20/18 0921)  iohexol (OMNIPAQUE) 350 MG/ML injection 75 mL (75 mLs Intravenous Contrast Given 09/20/18 1043)  levofloxacin (LEVAQUIN) tablet 750 mg (750 mg Oral Given 09/20/18 1201)     Initial Impression / Assessment and Plan / ED Course  I have reviewed the triage vital signs and the nursing notes.  Pertinent labs & imaging  results that were available during my care of the patient were reviewed by me and considered in my medical decision making (see chart for details).     Kristy Cox is a 73 year old female history of hypertension, high cholesterol who presents to the ED with fever, body aches.  Patient denies any specific urinary symptoms, cough, congestion.  Has had fever for the last 4 to 5 days.  Patient has had some diarrhea.  No abdominal pain.  No abdominal tenderness on exam.  Clear breath sounds on exam.  Infectious work-up was initiated with urine studies, chest x-ray.  Patient had fever but otherwise normal vitals.  No concern for systemic infection at this time but did get blood cultures, lactic acid.  Chest x-ray showed possible mass versus pneumonia and CT scan showed possibly pneumonia.  Patient had normal lactic acid.  No significant anemia, electrolyte abnormality.  No significant leukocytosis.  Patient with urinalysis suspicious for UTI as well.  However patient overall feels better following IV fluids and Tylenol.  Patient with normal vitals throughout my care.  No concern for sepsis given vital signs and lactic acid.  Shared decision was made to treat the patient for possible pneumonia and UTI with Levaquin.  She would like to avoid admission at this time which I think is reasonable given her work-up.  Recommend close follow-up with primary care doctor.  Told to return to the ED if symptoms worsen.  Patient with coronavirus testing  that was also negative.  Discharged from ED in good condition and understands return precautions.  This chart was dictated using voice recognition software.  Despite best efforts to proofread,  errors can occur which can change the documentation meaning.    Final Clinical Impressions(s) / ED Diagnoses   Final diagnoses:  Community acquired pneumonia, unspecified laterality  Urinary tract infection without hematuria, site unspecified    ED Discharge Orders          Ordered    levofloxacin (LEVAQUIN) 750 MG tablet  Daily     09/20/18 1315           Omaouratolo, Nataleah Scioneaux, DO 09/20/18 1315

## 2018-09-20 NOTE — Discharge Instructions (Signed)
Next dose of antibiotics tomorrow

## 2018-09-21 ENCOUNTER — Encounter (HOSPITAL_COMMUNITY): Payer: Self-pay | Admitting: Emergency Medicine

## 2018-09-21 ENCOUNTER — Other Ambulatory Visit: Payer: Self-pay

## 2018-09-21 ENCOUNTER — Emergency Department (HOSPITAL_COMMUNITY)
Admission: EM | Admit: 2018-09-21 | Discharge: 2018-09-21 | Disposition: A | Discharge status: Home / Self Care | Payer: BLUE CROSS/BLUE SHIELD | Attending: Emergency Medicine | Admitting: Emergency Medicine

## 2018-09-21 DIAGNOSIS — I10 Essential (primary) hypertension: Secondary | ICD-10-CM | POA: Diagnosis not present

## 2018-09-21 DIAGNOSIS — R079 Chest pain, unspecified: Secondary | ICD-10-CM | POA: Diagnosis present

## 2018-09-21 DIAGNOSIS — J189 Pneumonia, unspecified organism: Secondary | ICD-10-CM | POA: Insufficient documentation

## 2018-09-21 DIAGNOSIS — Z79899 Other long term (current) drug therapy: Secondary | ICD-10-CM | POA: Insufficient documentation

## 2018-09-21 LAB — BASIC METABOLIC PANEL
Anion gap: 8 (ref 5–15)
BUN: 10 mg/dL (ref 8–23)
CO2: 23 mmol/L (ref 22–32)
Calcium: 8.6 mg/dL — ABNORMAL LOW (ref 8.9–10.3)
Chloride: 105 mmol/L (ref 98–111)
Creatinine, Ser: 1.03 mg/dL — ABNORMAL HIGH (ref 0.44–1.00)
GFR calc Af Amer: >60 mL/min (ref >60)
GFR calc non Af Amer: 54 mL/min — ABNORMAL LOW (ref >60)
Glucose, Bld: 98 mg/dL (ref 70–99)
Potassium: 3.8 mmol/L (ref 3.5–5.1)
Sodium: 136 mmol/L (ref 135–145)

## 2018-09-21 LAB — CBC
HCT: 31.2 % — ABNORMAL LOW (ref 36.0–46.0)
Hemoglobin: 10.1 g/dL — ABNORMAL LOW (ref 12.0–15.0)
MCH: 30.2 pg (ref 26.0–34.0)
MCHC: 32.4 g/dL (ref 30.0–36.0)
MCV: 93.4 fL (ref 80.0–100.0)
Platelets: 179 10*3/uL (ref 150–400)
RBC: 3.34 MIL/uL — ABNORMAL LOW (ref 3.87–5.11)
RDW: 14.6 % (ref 11.5–15.5)
WBC: 5.4 10*3/uL (ref 4.0–10.5)
nRBC: 0 % (ref 0.0–0.2)

## 2018-09-21 LAB — TROPONIN I: Troponin I: <0.03 ng/mL (ref <0.03)

## 2018-09-21 MED ORDER — LEVOFLOXACIN IN D5W 750 MG/150ML IV SOLN
750.0000 mg | Freq: Once | INTRAVENOUS | Status: AC
  Administered 2018-09-21: 750 mg via INTRAVENOUS
  Filled 2018-09-21: qty 150

## 2018-09-21 MED ORDER — ALBUTEROL SULFATE HFA 108 (90 BASE) MCG/ACT IN AERS
2.0000 | INHALATION_SPRAY | RESPIRATORY_TRACT | Status: DC | PRN
  Administered 2018-09-21: 2 via RESPIRATORY_TRACT
  Filled 2018-09-21: qty 6.7

## 2018-09-21 NOTE — ED Notes (Signed)
Called son and told him pt will be discharged. He will be picking her up.

## 2018-09-21 NOTE — Discharge Instructions (Signed)
Please continue with your current antibiotic as treatment of your lung infection.  Use albuterol inhaler 2 puffs every 4 hours as needed for shortness of breath.  Follow up with your doctor for further care.  Return if you have any concerns.

## 2018-09-21 NOTE — ED Notes (Signed)
Kristy Cox- [phone]

## 2018-09-21 NOTE — ED Provider Notes (Signed)
[facility] EMERGENCY DEPARTMENT Provider Note   CSN: [id number] Arrival date & time: 09/21/18  1635    History   Chief Complaint Chief Complaint  Patient presents with  . Chest Pain    HPI Kristy Cox is a 73 y.o. female.     The history is provided by the patient and medical records. No language interpreter was used.  Chest Pain     73 year old female with history of hypertension, hypercholesterolemia, GERD presenting for evaluation of chest pain.  Patient developed fever body aches ongoing for the past 5 days.  Intermittent diarrhea.  She was seen in the ED yesterday for her complaint.  Work-up remarkable for evidence of possible pneumonia.  The urine at that time shows findings suspicious for UTI as well.  Patient received IV fluid and Tylenol and felt better.  She at that time preferred to go home.  She was discharged home with Levaquin as treatment.  Patient mention today she has pain to her midsternal region and towards her throat. Described as a pressure sensation with associated shortness of breath.  She felt that she cannot catch it deep breath.  She feels increasingly anxious.  She still does not endorse any productive cough.  She does admits to having a fever which improved with Tylenol.  She did take antibiotic prescribed earlier today.  She denies any recent sick contact.  Her COVID-19 test yesterday was normal.  Past Medical History:  Diagnosis Date  . Acid reflux   . Hypercholesteremia   . Hypertension   . Irregular heart beat    PVC's  . Osteoporosis     Patient Active Problem List   Diagnosis Date Noted  . Normocytic anemia 02/24/2014  . Back pain 02/24/2014  . Abdominal pain in female 01/01/2014  . Diverticula of colon 01/01/2014  . Nausea with vomiting 10/19/2013  . Epigastric pain 10/31/2012  . Metatarsalgia of left foot 12/26/2011  . Foot pain 12/25/2011  . Foot pain, left 12/19/2011  . Dizziness 09/09/2011  . HTN  (hypertension) 04/18/2011  . HLD (hyperlipidemia) 04/18/2011  . Osteoporosis 04/18/2011  . PVC's (premature ventricular contractions) 04/18/2011  . Acid reflux 04/18/2011    History reviewed. No pertinent surgical history.   OB History    Gravida  2   Para  2   Term  2   Preterm      AB      Living  2     SAB      TAB      Ectopic      Multiple      Live Births               Home Medications    Prior to Admission medications   Medication Sig Start Date End Date Taking? Authorizing Provider  acetaminophen (TYLENOL) 500 MG tablet Take 1,000 mg by mouth every 6 (six) hours as needed.    [provider]  COLLAGEN PO Take 1 tablet by mouth daily.    [provider]  cyclobenzaprine (FLEXERIL) 10 MG tablet Take 1 tablet (10 mg total) by mouth 2 (two) times daily as needed for muscle spasms. Patient not taking: Reported on 09/20/2018 09/25/15   Gareth Morgan, MD  dicyclomine (BENTYL) 20 MG tablet Take 1 tablet (20 mg total) by mouth 2 (two) times daily. Patient not taking: Reported on 09/20/2018 09/25/15   Gareth Morgan, MD  latanoprost (XALATAN) 0.005 % ophthalmic solution Place 1 drop into both eyes  at bedtime. 02/24/14   Elenora GammaBradshaw, Samuel L, MD  levofloxacin (LEVAQUIN) 750 MG tablet Take 1 tablet (750 mg total) by mouth daily for 9 days. 09/21/18 09/30/18  Curatolo, Adam, DO  losartan (COZAAR) 100 MG tablet Take 100 mg by mouth daily. 11/29/17   [provider]  losartan-hydrochlorothiazide (HYZAAR) 100-25 MG per tablet Take 1 tablet by mouth daily. Patient not taking: Reported on 09/20/2018 02/24/14   Elenora GammaBradshaw, Samuel L, MD  NIFEDICAL XL 30 MG 24 hr tablet Take 30 mg by mouth daily. 09/17/15   [provider]  pantoprazole (PROTONIX) 40 MG tablet Take 1 tablet (40 mg total) by mouth 2 (two) times daily. Patient taking differently: Take 40 mg by mouth daily before breakfast.  02/24/14   Elenora GammaBradshaw, Samuel L, MD  simvastatin (ZOCOR) 40 MG  tablet Take 1 tablet (40 mg total) by mouth at bedtime. 02/24/14   Elenora GammaBradshaw, Samuel L, MD    Family History Family History  Problem Relation Age of Onset  . Breast cancer Mother   . Lung cancer Sister   . Coronary artery disease Other     Social History Social History   Tobacco Use  . Smoking status: Never Smoker  . Smokeless tobacco: Never Used  Substance Use Topics  . Alcohol use: No  . Drug use: No     Allergies   Azithromycin; Codeine; Penicillins; and Prednisone   Review of Systems Review of Systems  Cardiovascular: Positive for chest pain.  All other systems reviewed and are negative.    Physical Exam Updated Vital Signs BP 140/75 (BP Location: Right Arm)   Temp 99.3 F (37.4 C) (Oral)   Resp (!) 22   SpO2 98%   Physical Exam Vitals signs and nursing note reviewed.  Constitutional:      General: She is not in acute distress.    Appearance: She is well-developed.  HENT:     Head: Atraumatic.  Eyes:     Conjunctiva/sclera: Conjunctivae normal.  Neck:     Musculoskeletal: Neck supple.  Cardiovascular:     Rate and Rhythm: Normal rate and regular rhythm.     Heart sounds: Normal heart sounds.  Pulmonary:     Effort: Pulmonary effort is normal.     Breath sounds: Normal breath sounds. No decreased breath sounds, wheezing or rhonchi.  Chest:     Chest wall: No tenderness.  Abdominal:     Palpations: Abdomen is soft.     Tenderness: There is no abdominal tenderness.  Musculoskeletal:     Right lower leg: No edema.     Left lower leg: No edema.  Skin:    Capillary Refill: Capillary refill takes less than 2 seconds.     Findings: No rash.  Neurological:     Mental Status: She is alert and oriented to person, place, and time.      ED Treatments / Results  Labs (all labs ordered are listed, but only abnormal results are displayed) Labs Reviewed  BASIC METABOLIC PANEL - Abnormal; Notable for the following components:      Result Value    Creatinine, Ser 1.03 (*)    Calcium 8.6 (*)    GFR calc non Af Amer 54 (*)    All other components within normal limits  CBC - Abnormal; Notable for the following components:   RBC 3.34 (*)    Hemoglobin 10.1 (*)    HCT 31.2 (*)    All other components within normal limits  TROPONIN I  EKG EKG Interpretation  Date/Time:  Sunday September 21 2018 16:49:56 EDT Ventricular Rate:  65 PR Interval:    QRS Duration: 98 QT Interval:  431 QTC Calculation: 449 R Axis:   -14 Text Interpretation:  Sinus rhythm Probable left ventricular hypertrophy Confirmed by Kristine RoyalMessick, Peter [phone]([id number]) on 09/21/2018 4:52:22 PM   Radiology Ct Angio Chest Pe W And/or Wo Contrast  Result Date: 09/20/2018 CLINICAL DATA:  Shortness of breath EXAM: CT ANGIOGRAPHY CHEST WITH CONTRAST TECHNIQUE: Multidetector CT imaging of the chest was performed using the standard protocol during bolus administration of intravenous contrast. Multiplanar CT image reconstructions and MIPs were obtained to evaluate the vascular anatomy. CONTRAST:  75mL OMNIPAQUE IOHEXOL 350 MG/ML SOLN COMPARISON:  Chest radiograph September 20, 2018; CT abdomen and pelvis September 25, 2015. Chest radiograph April 25, 2012 FINDINGS: Cardiovascular: There is no demonstrable pulmonary embolus. There is no appreciable thoracic aortic aneurysm or dissection. The visualized great vessels appear unremarkable. The right innominate and left common carotid arteries arise as a common trunk, an anatomic variant. There is no pericardial effusion or pericardial thickening. There is left ventricular hypertrophy. Note that there are foci of aortic atherosclerosis. Mediastinum/Nodes: Thyroid appears unremarkable. There is no demonstrable thoracic adenopathy. There is a small hiatal hernia. Lungs/Pleura: There is airspace consolidation in the posterior and lateral segments of the right lower lobe consistent with pneumonia. There is a small associated pleural effusion on the right. There is  atelectatic change in left base and lateral right mid lung regions. Upper Abdomen: There are multiple cysts throughout the visualized liver, similar to prior CT. Largest cyst is on the right posteriorly measuring 3.4 x 3.1 cm. There is upper abdominal aortic atherosclerosis. Musculoskeletal: There is thoracic dextroscoliosis. There is anterior wedging of the T6 vertebral body. There are no blastic or lytic bone lesions. No chest wall lesions are evident. Review of the MIP images confirms the above findings. IMPRESSION: 1. No demonstrable pulmonary embolus. No thoracic aortic aneurysm or dissection. There are foci of aortic atherosclerosis. There is left ventricular hypertrophy. 2. Airspace consolidation consistent with pneumonia involving portions of the posterior and lateral segments of the right lower lobe. Small right pleural effusion. Areas of atelectatic change in the left base and right middle lobe laterally. 3.  No appreciable thoracic adenopathy. 4.  Small hiatal hernia. 5.  Chronic anterior wedging of the T6 vertebral body. Aortic Atherosclerosis (ICD10-I70.0). Electronically Signed   By: Bretta BangWilliam  Woodruff III M.D.   On: 09/20/2018 11:24   Dg Chest Portable 1 View  Result Date: 09/20/2018 CLINICAL DATA:  Patient with fever and diarrhea EXAM: PORTABLE CHEST 1 VIEW COMPARISON:  Chest radiograph 04/25/2012 FINDINGS: Multiple monitoring leads overlie the patient. Stable cardiomegaly. Masslike area of consolidation right lower hemithorax. No pleural effusion or pneumothorax. IMPRESSION: Masslike area of consolidation within the right lower hemithorax may represent infection or pulmonary malignancy. Given the appearance on chest radiograph, recommend further evaluation with chest CT. Electronically Signed   By: Annia Beltrew  Davis M.D.   On: 09/20/2018 10:00    Procedures Procedures (including critical care time)  Medications Ordered in ED Medications  albuterol (VENTOLIN HFA) 108 (90 Base) MCG/ACT inhaler 2  puff (2 puffs Inhalation Given 09/21/18 1934)  levofloxacin (LEVAQUIN) IVPB 750 mg (750 mg Intravenous New Bag/Given 09/21/18 1813)     Initial Impression / Assessment and Plan / ED Course  I have reviewed the triage vital signs and the nursing notes.  Pertinent labs & imaging results that were available during my  care of the patient were reviewed by me and considered in my medical decision making (see chart for details).        BP (!) 148/72   Pulse 67   Temp 99.3 F (37.4 C) (Oral)   Resp (!) 28   SpO2 93%    Final Clinical Impressions(s) / ED Diagnoses   Final diagnoses:  Community acquired pneumonia, unspecified laterality    ED Discharge Orders    None     5:06 PM Patient has had fever, and body aches for the past 5 days.  Seen yesterday and work-up remarkable for evidence of pneumonia as well as possible UTI.  Patient discharged home with Levaquin.  She is here due to worsening shortness of breath.  On exam, she appears to be mildly tachypneic, anxious however O2 sats is at 99% on room air.  She has had a chest CT angiogram yesterday without evidence of PE.  I suspect her symptom is likely secondary to her lung infection.  Given worsening symptoms, may consider admission.  Covid-19 testing yesterday was normal.  7:02 PM No significant changes in her labs.  EKG and troponin without finding to suggest ACS.  Patient is not hypoxic, her O2 sats is at 100% on room air.  Patient received IV Levaquin here.  Albuterol inhaler provided for respiratory discomfort.  Care discussed with Dr. Rodena MedinMessick.   7:59 PM Patient able to ambulate without hypoxia.  Patient given albuterol inhaler to use as needed for her shortness of breath.  She received IV Levaquin in the ED.  At this time after discussion, I felt patient is stable for discharge and will continue with her current antibiotic regimen.  However, she understand that she can return promptly if her condition worsen.   Fayrene Helperran, Loren Vicens, PA-C  09/21/18 2020    Wynetta FinesMessick, Peter C, MD 09/23/18 [phone]

## 2018-09-21 NOTE — ED Triage Notes (Addendum)
Pt here for eval of shortness of breath and chest congestion that started this morning. Was here yesterday for fever and diarrhea X 4 days, denies "any recent fevers" during triage. Pt short of breath while talking.

## 2018-09-21 NOTE — ED Notes (Signed)
Kristy Cox has not taken her BP med since Wed

## 2018-09-23 LAB — URINE CULTURE
Culture: >=100000 — AB
Special Requests: NORMAL

## 2018-09-24 ENCOUNTER — Telehealth: Payer: Self-pay | Admitting: *Deleted

## 2018-09-24 NOTE — Telephone Encounter (Signed)
Post ED Visit - Positive Culture Follow-up  Culture report reviewed by antimicrobial stewardship pharmacist: [place] Team []  Elenor Quinones, Pharm.D. []  Heide Guile, Pharm.D., BCPS AQ-ID []  Parks Neptune, Pharm.D., BCPS []  Alycia Rossetti, Pharm.D., BCPS []  [place], [place].D., BCPS, AAHIVP []  Legrand Como, Pharm.D., BCPS, AAHIVP []  Salome Arnt, PharmD, BCPS []  Johnnette Gourd, PharmD, BCPS []  Hughes Better, PharmD, BCPS []  Leeroy Cha, PharmD []  Laqueta Linden, PharmD, BCPS []  Albertina Parr, PharmD Isaias Sakai, PharmD  [place] Team []  Leodis Sias, PharmD []  Lindell Spar, PharmD []  Royetta Asal, PharmD []  Graylin Shiver, Rph []  Rema Fendt) Glennon Mac, PharmD []  Arlyn Dunning, PharmD []  Netta Cedars, PharmD []  Dia Sitter, PharmD []  Leone Haven, PharmD []  Gretta Arab, PharmD []  Theodis Shove, PharmD []  Peggyann Juba, PharmD []  Reuel Boom, PharmD   Positive urine culture Treated with Levofloxacin, organism sensitive to the same and no further patient follow-up is required at this time.  Harlon Flor [facility] 09/24/2018, 9:00 AM

## 2018-09-25 LAB — CULTURE, BLOOD (ROUTINE X 2)
Culture: NO GROWTH
Culture: NO GROWTH
Special Requests: ADEQUATE

## 2018-10-12 ENCOUNTER — Encounter (HOSPITAL_COMMUNITY): Payer: Self-pay

## 2018-10-12 ENCOUNTER — Emergency Department (HOSPITAL_COMMUNITY)
Admission: EM | Admit: 2018-10-12 | Discharge: 2018-10-12 | Disposition: A | Discharge status: Home / Self Care | Payer: BLUE CROSS/BLUE SHIELD | Attending: Emergency Medicine | Admitting: Emergency Medicine

## 2018-10-12 DIAGNOSIS — R42 Dizziness and giddiness: Secondary | ICD-10-CM | POA: Diagnosis present

## 2018-10-12 DIAGNOSIS — Z79899 Other long term (current) drug therapy: Secondary | ICD-10-CM | POA: Insufficient documentation

## 2018-10-12 DIAGNOSIS — I10 Essential (primary) hypertension: Secondary | ICD-10-CM | POA: Diagnosis not present

## 2018-10-12 LAB — COMPREHENSIVE METABOLIC PANEL
ALT: 12 U/L (ref 0–44)
AST: 18 U/L (ref 15–41)
Albumin: 4.2 g/dL (ref 3.5–5.0)
Alkaline Phosphatase: 70 U/L (ref 38–126)
Anion gap: 11 (ref 5–15)
BUN: 10 mg/dL (ref 8–23)
CO2: 24 mmol/L (ref 22–32)
Calcium: 9.7 mg/dL (ref 8.9–10.3)
Chloride: 105 mmol/L (ref 98–111)
Creatinine, Ser: 0.84 mg/dL (ref 0.44–1.00)
GFR calc Af Amer: >60 mL/min (ref >60)
GFR calc non Af Amer: >60 mL/min (ref >60)
Glucose, Bld: 140 mg/dL — ABNORMAL HIGH (ref 70–99)
Potassium: 3.4 mmol/L — ABNORMAL LOW (ref 3.5–5.1)
Sodium: 140 mmol/L (ref 135–145)
Total Bilirubin: 0.5 mg/dL (ref 0.3–1.2)
Total Protein: 7.9 g/dL (ref 6.5–8.1)

## 2018-10-12 LAB — CBC WITH DIFFERENTIAL/PLATELET
Abs Immature Granulocytes: 0.01 10*3/uL (ref 0.00–0.07)
Basophils Absolute: 0 10*3/uL (ref 0.0–0.1)
Basophils Relative: 1 %
Eosinophils Absolute: 0 10*3/uL (ref 0.0–0.5)
Eosinophils Relative: 1 %
HCT: 33.7 % — ABNORMAL LOW (ref 36.0–46.0)
Hemoglobin: 10.6 g/dL — ABNORMAL LOW (ref 12.0–15.0)
Immature Granulocytes: 0 %
Lymphocytes Relative: 35 %
Lymphs Abs: 1.1 10*3/uL (ref 0.7–4.0)
MCH: 29.9 pg (ref 26.0–34.0)
MCHC: 31.5 g/dL (ref 30.0–36.0)
MCV: 95.2 fL (ref 80.0–100.0)
Monocytes Absolute: 0.4 10*3/uL (ref 0.1–1.0)
Monocytes Relative: 14 %
Neutro Abs: 1.5 10*3/uL — ABNORMAL LOW (ref 1.7–7.7)
Neutrophils Relative %: 49 %
Platelets: 245 10*3/uL (ref 150–400)
RBC: 3.54 MIL/uL — ABNORMAL LOW (ref 3.87–5.11)
RDW: 14.8 % (ref 11.5–15.5)
WBC: 3.1 10*3/uL — ABNORMAL LOW (ref 4.0–10.5)
nRBC: 0 % (ref 0.0–0.2)

## 2018-10-12 LAB — CBG MONITORING, ED: Glucose-Capillary: 126 mg/dL — ABNORMAL HIGH (ref 70–99)

## 2018-10-12 MED ORDER — MECLIZINE HCL 25 MG PO TABS: 25.0000 mg | ORAL_TABLET | Freq: Two times a day (BID) | ORAL | 0 refills | Status: DC | PRN

## 2018-10-12 MED ORDER — MECLIZINE HCL 25 MG PO TABS
25.0000 mg | ORAL_TABLET | Freq: Once | ORAL | Status: AC
  Administered 2018-10-12: 25 mg via ORAL
  Filled 2018-10-12: qty 1

## 2018-10-12 MED ORDER — SODIUM CHLORIDE 0.9 % IV BOLUS
1000.0000 mL | Freq: Once | INTRAVENOUS | Status: AC
  Administered 2018-10-12: 1000 mL via INTRAVENOUS

## 2018-10-12 NOTE — ED Triage Notes (Signed)
Seen at [facility] this AM for evaluation of dizziness, at [facility] heart rate was 170, advised to come here  Seen last week for SOB and chest congestion, diarrhea and fevers, was COVID negative

## 2018-10-12 NOTE — ED Provider Notes (Signed)
[facility]   CSN: [id number] Arrival date & time: 10/12/18  [id number]    History   Chief Complaint No chief complaint on file.   HPI Kristy Cox is a 73 y.o. female.     73yo F w/ PMH including HTN, HLD who p/w dizziness. This morning after pt woke up, she sat up to get out of bed and began feeling dizzy which she describes as a room spinning sensation.  She laid back down but then again when trying to get up started feeling dizzy again.  She went to an urgent care near her house and they sent her here due to concerns for elevated heart rate.  She reports that while she is laying in bed currently, she does not feel symptomatic, it is mainly when she tries to sit up or stand up.  She denies any associated headache, vision changes, chest pain, shortness of breath, palpitations, recent fevers, or other complaints.  No recent changes to her medicines.  She was diagnosed with pneumonia a [facility] while ago and completed treatment for it, has felt okay since then.  May have slightly decreased appetite but otherwise feels well.  The history is provided by the patient.    Past Medical History:  Diagnosis Date  . Acid reflux   . Hypercholesteremia   . Hypertension   . Irregular heart beat    PVC's  . Osteoporosis     Patient Active Problem List   Diagnosis Date Noted  . Normocytic anemia 02/24/2014  . Back pain 02/24/2014  . Abdominal pain in female 01/01/2014  . Diverticula of colon 01/01/2014  . Nausea with vomiting 10/19/2013  . Epigastric pain 10/31/2012  . Metatarsalgia of left foot 12/26/2011  . Foot pain 12/25/2011  . Foot pain, left 12/19/2011  . Dizziness 09/09/2011  . HTN (hypertension) 04/18/2011  . HLD (hyperlipidemia) 04/18/2011  . Osteoporosis 04/18/2011  . PVC's (premature ventricular contractions) 04/18/2011  . Acid reflux 04/18/2011    History reviewed. No pertinent surgical history.   OB History    Gravida  2   Para  2   Term  2   Preterm      AB      Living  2     SAB      TAB      Ectopic      Multiple      Live Births               Home Medications    Prior to Admission medications   Medication Sig Start Date End Date Taking? Authorizing Provider  acetaminophen (TYLENOL) 500 MG tablet Take 1,000 mg by mouth every 6 (six) hours as needed for mild pain or moderate pain.    Yes [provider]  latanoprost (XALATAN) 0.005 % ophthalmic solution Place 1 drop into both eyes at bedtime. 02/24/14  Yes Elenora GammaBradshaw, Samuel L, MD  losartan (COZAAR) 100 MG tablet Take 100 mg by mouth daily. 11/29/17  Yes [provider]  NIFEDICAL XL 30 MG 24 hr tablet Take 30 mg by mouth daily. 09/17/15  Yes [provider]  pantoprazole (PROTONIX) 40 MG tablet Take 1 tablet (40 mg total) by mouth 2 (two) times daily. Patient taking differently: Take 40 mg by mouth daily before breakfast.  02/24/14  Yes Elenora GammaBradshaw, Samuel L, MD  simvastatin (ZOCOR) 40 MG tablet Take 1 tablet (40 mg total) by mouth at bedtime. 02/24/14  Yes Kevin FentonBradshaw, Samuel  L, MD  COLLAGEN PO Take 1 tablet by mouth daily.    [provider]  meclizine (ANTIVERT) 25 MG tablet Take 1 tablet (25 mg total) by mouth 2 (two) times daily as needed for dizziness. 10/12/18   Shalee Paolo, Wenda Overland, MD  dicyclomine (BENTYL) 20 MG tablet Take 1 tablet (20 mg total) by mouth 2 (two) times daily. Patient not taking: Reported on 09/20/2018 09/25/15 10/12/18  Gareth Morgan, MD    Family History Family History  Problem Relation Age of Onset  . Breast cancer Mother   . Lung cancer Sister   . Coronary artery disease Other     Social History Social History   Tobacco Use  . Smoking status: Never Smoker  . Smokeless tobacco: Never Used  Substance Use Topics  . Alcohol use: No  . Drug use: No     Allergies   Azithromycin, Codeine, Penicillins, and Prednisone   Review of Systems Review of Systems  All other systems reviewed and are negative except that which was mentioned in HPI   Physical Exam Updated Vital Signs BP (!) 176/100   Pulse 78   Temp 98.6 F (37 C) (Oral)   Resp 19   SpO2 100%   Physical Exam Vitals signs and nursing note reviewed.  Constitutional:      General: She is not in acute distress.    Appearance: She is well-developed.     Comments: Awake, alert  HENT:     Head: Normocephalic and atraumatic.     Right Ear: Tympanic membrane normal.     Left Ear: Tympanic membrane normal.     Nose: Nose normal.     Mouth/Throat:     Mouth: Mucous membranes are moist.     Pharynx: Oropharynx is clear.  Eyes:     Extraocular Movements: Extraocular movements intact.     Conjunctiva/sclera: Conjunctivae normal.     Pupils: Pupils are equal, round, and reactive to light.  Neck:     Musculoskeletal: Neck supple.  Cardiovascular:     Rate and Rhythm: Normal rate and regular rhythm.     Heart sounds: Normal heart sounds. No murmur.  Pulmonary:     Effort: Pulmonary effort is normal. No respiratory distress.     Breath sounds: Normal breath sounds.  Abdominal:     General: Bowel sounds are normal. There is no distension.     Palpations: Abdomen is soft.     Tenderness: There is no abdominal tenderness.  Skin:    General: Skin is warm and dry.  Neurological:     Mental Status: She is alert and oriented to person, place, and time.     Cranial Nerves: No cranial nerve deficit.     Motor: No abnormal muscle tone.     Deep Tendon Reflexes: Reflexes are normal and symmetric.     Comments: Fluent speech, normal finger-to-nose testing, negative pronator drift, no clonus 5/5 strength and normal sensation x all 4 extremities  Psychiatric:        Thought Content: Thought content normal.        Judgment: Judgment normal.      ED Treatments / Results  Labs (all labs ordered are listed, but only abnormal results are displayed) Labs Reviewed  COMPREHENSIVE METABOLIC  PANEL - Abnormal; Notable for the following components:      Result Value   Potassium 3.4 (*)    Glucose, Bld 140 (*)    All other components within normal limits  CBC  WITH DIFFERENTIAL/PLATELET - Abnormal; Notable for the following components:   WBC 3.1 (*)    RBC 3.54 (*)    Hemoglobin 10.6 (*)    HCT 33.7 (*)    Neutro Abs 1.5 (*)    All other components within normal limits  CBG MONITORING, ED - Abnormal; Notable for the following components:   Glucose-Capillary 126 (*)    All other components within normal limits    EKG EKG Interpretation  Date/Time:  Sunday October 12 2018 09:06:36 EDT Ventricular Rate:  77 PR Interval:    QRS Duration: 101 QT Interval:  388 QTC Calculation: 440 R Axis:   -14 Text Interpretation:  Sinus rhythm Nonspecific T abnrm, anterolateral leads some baseline artifact, needs repeat Confirmed by Harbert Fitterer ([id number]) on 10/12/2018 9:14:42 AM   Radiology No results found.  Procedures Procedures (including critical care time)  Medications Ordered in ED Medications  sodium chloride 0.9 % bolus 1,000 mL (1,000 mLs Intravenous New Bag/Given 10/12/18 1047)  meclizine (ANTIVERT) tablet 25 mg (25 mg Oral Given 10/12/18 1046)   Orthostatic VS for the past 24 hrs:  BP- Lying Pulse- Lying BP- Sitting Pulse- Sitting BP- Standing at 0 minutes Pulse- Standing at 0 minutes  10/12/18 1012 153/80 62 162/87 69 175/85 80      Initial Impression / Assessment and Plan / ED Course  I have reviewed the triage vital signs and the nursing notes.  Pertinent labs & imaging results that were available during my care of the patient were reviewed by me and considered in my medical decision making (see chart for details).        Well-appearing on exam, normal heart rate, EKG without arrhythmia.  Normal neurologic exam.  Her description suggests peripheral vertigo. HR increased from 62 to 80 on orthostatics, gave IVF bolus as well as meclizine. Labs show CBC similar to  previous, reassuring CMP.  After above medications, pt well appearing on reassessment and states that dizziness has resolved.  She was able to ambulate in the ED with no problems.  I have discussed supportive measures for her symptoms, emphasized importance of good hydration, and recommended trying Epley maneuvers and/or meclizine at home if she has further episodes of dizziness.  I have extensively reviewed return precautions including persistent vertigo, new neurologic symptoms, or other sudden changes.  She voiced understanding.  Final Clinical Impressions(s) / ED Diagnoses   Final diagnoses:  Dizziness    ED Discharge Orders         Ordered    meclizine (ANTIVERT) 25 MG tablet  2 times daily PRN     06 /28/20 1409           Wylma Tatem, Ambrose Finlandachel Morgan, MD 10/12/18 1415

## 2018-10-12 NOTE — ED Notes (Signed)
CBG is 126.

## 2018-10-12 NOTE — ED Triage Notes (Signed)
Pt's HR 90-76 here

## 2018-10-12 NOTE — ED Notes (Signed)
Pt ambulated in hallway, only complaint was it was tiring. Balance was good.

## 2018-10-12 NOTE — ED Notes (Signed)
Pt found to be tearful over weakness. Offered to call her son, declined desire to call son at this time

## 2020-01-01 IMAGING — CT CT ANGIOGRAPHY CHEST
1 of 7 series · 3 of 16 positions shown · IV contrast (omnipaque)
Comparison: Chest radiograph September 20, 2018; CT abdomen and pelvis
September 25, 2015. Chest radiograph April 25, 2012

CLINICAL DATA: Shortness of breath

EXAM:
CT ANGIOGRAPHY CHEST WITH CONTRAST
TECHNIQUE: Multidetector CT imaging of the chest was performed using the
standard protocol during bolus administration of intravenous
contrast. Multiplanar CT image reconstructions and MIPs were
obtained to evaluate the vascular anatomy.
CONTRAST:  75mL OMNIPAQUE IOHEXOL 350 MG/ML SOLN

[Series 6: pe thins · axial · 0.60mm/px · z∈[+1247,+1373]mm · 3 of 362 slices shown]
[im 91/362  lung]
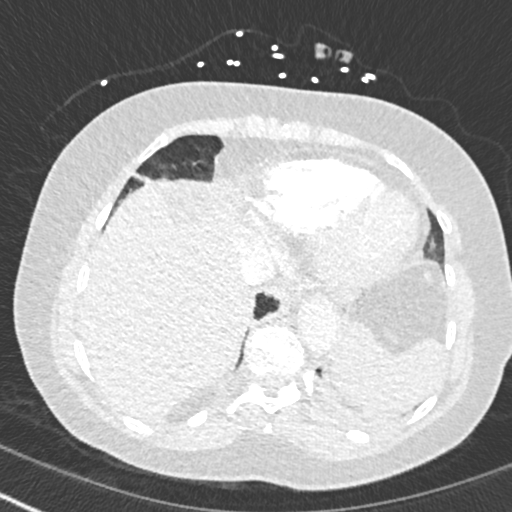
[im 181/362  soft-tissue]
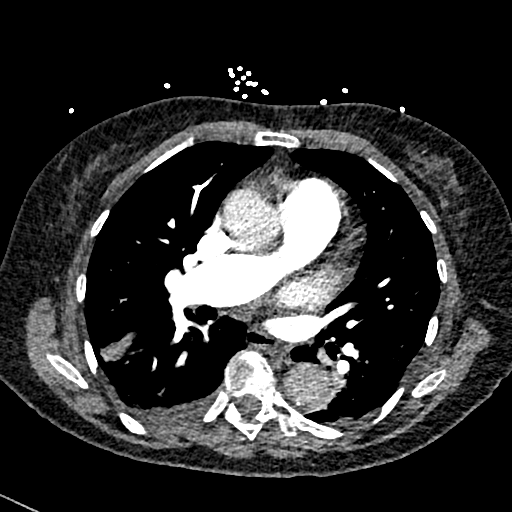
[im 271/362  lung]
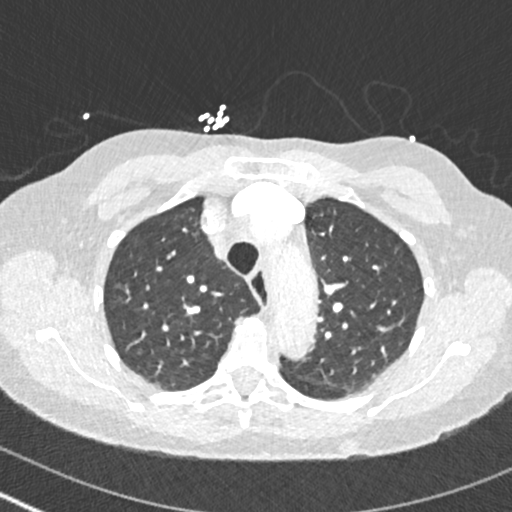

[3 of 16 positions shown; findings below may reference images not displayed]

FINDINGS: Cardiovascular: There is no demonstrable pulmonary embolus. There is
no appreciable thoracic aortic aneurysm or dissection. The
visualized great vessels appear unremarkable. The right innominate
and left common carotid arteries arise as a common trunk, an
anatomic variant. There is no pericardial effusion or pericardial
thickening. There is left ventricular hypertrophy. Note that there
are foci of aortic atherosclerosis.

Mediastinum/Nodes: Thyroid appears unremarkable. There is no
demonstrable thoracic adenopathy. There is a small hiatal hernia.

Lungs/Pleura: There is airspace consolidation in the posterior and
lateral segments of the right lower lobe consistent with pneumonia.
There is a small associated pleural effusion on the right. There is
atelectatic change in left base and lateral right mid lung regions.

Upper Abdomen: There are multiple cysts throughout the visualized
liver, similar to prior CT. Largest cyst is on the right posteriorly
measuring 3.4 x 3.1 cm. There is upper abdominal aortic
atherosclerosis.

Musculoskeletal: There is thoracic dextroscoliosis. There is
anterior wedging of the T6 vertebral body. There are no blastic or
lytic bone lesions. No chest wall lesions are evident.

Review of the MIP images confirms the above findings.
IMPRESSION: 1. No demonstrable pulmonary embolus. No thoracic aortic aneurysm or
dissection. There are foci of aortic atherosclerosis. There is left
ventricular hypertrophy.

2. Airspace consolidation consistent with pneumonia involving
portions of the posterior and lateral segments of the right lower
lobe. Small right pleural effusion. Areas of atelectatic change in
the left base and right middle lobe laterally.

3.  No appreciable thoracic adenopathy.

4.  Small hiatal hernia.

5.  Chronic anterior wedging of the T6 vertebral body.

Aortic Atherosclerosis ([id number].F).

## 2021-03-16 ENCOUNTER — Other Ambulatory Visit: Payer: Self-pay | Admitting: Physician Assistant

## 2023-04-20 ENCOUNTER — Encounter (HOSPITAL_COMMUNITY): Payer: Self-pay

## 2023-04-20 ENCOUNTER — Emergency Department (HOSPITAL_COMMUNITY)
Admission: EM | Admit: 2023-04-20 | Discharge: 2023-04-20 | Disposition: A | Discharge status: Home / Self Care | Payer: Medicaid Other | Attending: Emergency Medicine | Admitting: Emergency Medicine

## 2023-04-20 ENCOUNTER — Other Ambulatory Visit: Payer: Self-pay

## 2023-04-20 DIAGNOSIS — R42 Dizziness and giddiness: Secondary | ICD-10-CM | POA: Diagnosis present

## 2023-04-20 DIAGNOSIS — I10 Essential (primary) hypertension: Secondary | ICD-10-CM | POA: Insufficient documentation

## 2023-04-20 DIAGNOSIS — Z79899 Other long term (current) drug therapy: Secondary | ICD-10-CM | POA: Diagnosis not present

## 2023-04-20 LAB — CBC WITH DIFFERENTIAL/PLATELET
Abs Immature Granulocytes: 0.01 10*3/uL (ref 0.00–0.07)
Basophils Absolute: 0 10*3/uL (ref 0.0–0.1)
Basophils Relative: 1 %
Eosinophils Absolute: 0.1 10*3/uL (ref 0.0–0.5)
Eosinophils Relative: 2 %
HCT: 38.9 % (ref 36.0–46.0)
Hemoglobin: 12.5 g/dL (ref 12.0–15.0)
Immature Granulocytes: 0 %
Lymphocytes Relative: 39 %
Lymphs Abs: 1.4 10*3/uL (ref 0.7–4.0)
MCH: 31 pg (ref 26.0–34.0)
MCHC: 32.1 g/dL (ref 30.0–36.0)
MCV: 96.5 fL (ref 80.0–100.0)
Monocytes Absolute: 0.4 10*3/uL (ref 0.1–1.0)
Monocytes Relative: 10 %
Neutro Abs: 1.8 10*3/uL (ref 1.7–7.7)
Neutrophils Relative %: 48 %
Platelets: 221 10*3/uL (ref 150–400)
RBC: 4.03 MIL/uL (ref 3.87–5.11)
RDW: 13.2 % (ref 11.5–15.5)
WBC: 3.7 10*3/uL — ABNORMAL LOW (ref 4.0–10.5)
nRBC: 0 % (ref 0.0–0.2)

## 2023-04-20 LAB — BASIC METABOLIC PANEL
Anion gap: 11 (ref 5–15)
BUN: 15 mg/dL (ref 8–23)
CO2: 27 mmol/L (ref 22–32)
Calcium: 9.6 mg/dL (ref 8.9–10.3)
Chloride: 102 mmol/L (ref 98–111)
Creatinine, Ser: 0.67 mg/dL (ref 0.44–1.00)
GFR, Estimated: >60 mL/min (ref >60)
Glucose, Bld: 106 mg/dL — ABNORMAL HIGH (ref 70–99)
Potassium: 4.3 mmol/L (ref 3.5–5.1)
Sodium: 140 mmol/L (ref 135–145)

## 2023-04-20 MED ORDER — LISINOPRIL 10 MG PO TABS: 10.0000 mg | ORAL_TABLET | Freq: Two times a day (BID) | ORAL | 0 refills | Status: DC

## 2023-04-20 NOTE — ED Provider Triage Note (Signed)
 Emergency Medicine Provider Triage Evaluation Note  Kristy Cox , a 78 y.o. female  was evaluated in triage.  Pt complains of blood pressure issue.  Review of Systems  Positive: Dizziness Negative: N/V, chest pain, weakness, SOB  Physical Exam  BP (!) 200/75   Pulse (!) 57   Temp 98 F (36.7 C)   Resp 17   SpO2 100%  Gen:   Awake, no distress   Resp:  Normal effort  MSK:   Moves extremities without difficulty  Other:    Medical Decision Making  Medically screening exam initiated at 5:51 PM.  Appropriate orders placed.  Abby Tucholski was informed that the remainder of the evaluation will be completed by another provider, this initial triage assessment does not replace that evaluation, and the importance of remaining in the ED until their evaluation is complete.  Labs ordered   Francis Ileana SAILOR, PA-C 04/20/23 1754

## 2023-04-20 NOTE — Discharge Instructions (Addendum)
 Continue taking all of your other medications as prescribed.  I would like you to take 10 mg of lisinopril  in the morning, and 10 mg of lisinopril  in the evening.  Please call your doctor on Monday to discuss these changes and to make a follow-up appointment.

## 2023-04-20 NOTE — ED Triage Notes (Signed)
 Pt c.o HTN for the past 2 weeks, states her Bp has been fluctuating since but today she was cooking and she suddenly felt dizzy. Pt Bp at home was 189/98. Pt denies headache or nausea.

## 2023-04-20 NOTE — ED Provider Notes (Signed)
 [place] EMERGENCY DEPARTMENT AT [facility] Provider Note   CSN: [id number] Arrival date & time: 04/20/23  1419     History  Chief Complaint  Patient presents with   Hypertension   Dizziness    Imoni Kohen is a 78 y.o. female.  78 year old female here today for hypertension.  Patient has recently been started on lisinopril , has been having increased blood pressure readings.  Today, she noticed her systolic blood pressure was 200 and at that point she felt lightheaded.  Patient currently without headache or chest pain.   Hypertension  Dizziness      Home Medications Prior to Admission medications   Medication Sig Start Date End Date Taking? Authorizing Provider  lisinopril  (ZESTRIL ) 10 MG tablet Take 1 tablet (10 mg total) by mouth 2 (two) times daily. 04/20/23 05/20/23 Yes Mannie Pac T, DO  acetaminophen  (TYLENOL ) 500 MG tablet Take 1,000 mg by mouth every 6 (six) hours as needed for mild pain or moderate pain.     [provider]  COLLAGEN PO Take 1 tablet by mouth daily.    [provider]  latanoprost  (XALATAN ) 0.005 % ophthalmic solution Place 1 drop into both eyes at bedtime. 02/24/14   Harl Jayson CROME, MD  losartan  (COZAAR ) 100 MG tablet Take 100 mg by mouth daily. 11/29/17   [provider]  meclizine  (ANTIVERT ) 25 MG tablet Take 1 tablet (25 mg total) by mouth 2 (two) times daily as needed for dizziness. 10/12/18   Little, Vernell Search, MD  NIFEDICAL XL 30 MG 24 hr tablet Take 30 mg by mouth daily. 09/17/15   [provider]  pantoprazole  (PROTONIX ) 40 MG tablet Take 1 tablet (40 mg total) by mouth 2 (two) times daily. Patient taking differently: Take 40 mg by mouth daily before breakfast.  02/24/14   Harl Jayson CROME, MD  simvastatin  (ZOCOR ) 40 MG tablet Take 1 tablet (40 mg total) by mouth at bedtime. 02/24/14   Harl Jayson CROME, MD  dicyclomine  (BENTYL ) 20 MG tablet Take 1 tablet (20 mg total) by mouth 2  (two) times daily. Patient not taking: Reported on 09/20/2018 09/25/15 10/12/18  Dreama Longs, MD      Allergies    Azithromycin , Codeine, Penicillins, and Prednisone    Review of Systems   Review of Systems  Neurological:  Positive for dizziness.    Physical Exam Updated Vital Signs BP (!) 172/77   Pulse (!) 55   Temp 97.9 F (36.6 C) (Oral)   Resp 20   SpO2 100%  Physical Exam Vitals reviewed.  Cardiovascular:     Rate and Rhythm: Normal rate.     Pulses: Normal pulses.  Pulmonary:     Effort: Pulmonary effort is normal.  Abdominal:     General: Abdomen is flat.  Skin:    General: Skin is warm.  Neurological:     General: No focal deficit present.     Mental Status: She is alert.     Motor: No weakness.     Gait: Gait normal.     ED Results / Procedures / Treatments   Labs (all labs ordered are listed, but only abnormal results are displayed) Labs Reviewed  BASIC METABOLIC PANEL - Abnormal; Notable for the following components:      Result Value   Glucose, Bld 106 (*)    All other components within normal limits  CBC WITH DIFFERENTIAL/PLATELET - Abnormal; Notable for the following components:   WBC 3.7 (*)  All other components within normal limits    EKG EKG Interpretation Date/Time:  Saturday April 20 2023 18:00:47 EST Ventricular Rate:  52 PR Interval:  168 QRS Duration:  94 QT Interval:  460 QTC Calculation: 427 R Axis:   -5  Text Interpretation: Sinus bradycardia with sinus arrhythmia Moderate voltage criteria for LVH, may be normal variant ( R in aVL , [employer] product ) Nonspecific T wave abnormality Abnormal ECG When compared with ECG of 12-Oct-2018 10:12, PREVIOUS ECG IS PRESENT Confirmed by Mannie Pac [phone]) on 04/20/2023 7:48:44 PM  Radiology No results found.  Procedures Procedures    Medications Ordered in ED Medications - No data to display  ED Course/ Medical Decision Making/ A&P                                  Medical Decision Making 78 year old female here today for hypertension.  Plan-patient without any signs of endorgan damage, no chest pain, normal gait, normal renal function.  Your EKG, per my independent review, does not show any evidence of acute ischemia.  Patient tells me that she has been taking 120 mg of diltiazem for her hypertension, which is an interesting selection.  She was started on 5 mg of lisinopril , recently increased to 10.  Will have the patient take 10 mg twice daily.  She will follow-up with her PCP this week.  In the patient's chart, says that she is on losartan  and Nifedical, however the patient shows me a list of the medication she is currently taking and either is on there.  Risk Prescription drug management.           Final Clinical Impression(s) / ED Diagnoses Final diagnoses:  Primary hypertension    Rx / DC Orders ED Discharge Orders          Ordered    lisinopril  (ZESTRIL ) 10 MG tablet  2 times daily        04/20/23 1950              Mannie Pac T, DO 04/20/23 1951

## 2023-05-22 NOTE — Progress Notes (Signed)
 [facility]  - Family Medicine [facility]  Date of Service: 05/24/2023 Patient Name: Kristy Cox Patient DOB: 1945-06-07   Subjective:   Patient comes in for Hypertension  Kristy Cox is here for hypertension follow up.   Last hypertension follow-up 1 months ago.   Patient does exercising regularly. Patient watches sodium intake. Home blood pressuremonitoring:  Yes Patient denies chest pain, exertional chest pressure/discomfort, lower extremity edema and palpitations. BP Readings from Last 3 Encounters:  05/24/23 (!) 202/98  04/29/23 (!) 180/98  04/07/23 (!) 188/80                        Wt Readings from Last 3 Encounters:  05/24/23 68.4 kg (150 lb 14.4 oz)  04/29/23 68 kg (149 lb 14.4 oz)  04/23/23 67.9 kg (149 lb 12.8 oz)      Lab Results  Component Value Date   CHOL 200 (H) 11/13/2022   TRIG 101 11/13/2022   HDL 55 (L) 11/13/2022   LDLCALC 125 (H) 11/13/2022    Hypertension treatment goalsreviewed with the patient:  LDL cholesterol <100 and blood pressure <140/90. If not at goal, the following barriers to care were identified: none.  Social History   Tobacco Use  . Smoking status: Never  . Smokeless tobacco: Never  Substance Use Topics  . Alcohol use: No  . Drug use: No    Review of Systems  Respiratory:  Negative for cough and shortness of breath.   Cardiovascular:  Negative for chest pain.  Gastrointestinal:  Negative for abdominal pain.  Musculoskeletal:  Negative for back pain.  Neurological:  Negative for headaches.     The following portions of the patient's history were reviewed and updated as appropriate: allergies, currentmedications, past family history, past medical history, past surgical history and problem list.   Objective:   Vital Signs BP (!) 202/98 (BP Location: Left arm, Patient Position: Sitting)   Pulse 57   Temp 98 F (36.7 C) (Temporal)   Ht 1.626 m (5' 4)   Wt 68.4 kg (150 lb 14.4 oz)   SpO2 98%   BMI  25.90 kg/m   Constitutional.  Well appearing 78 y.o. female, well developed, well nourished, no acutedistress. Respiratory.  Clear bilaterally, breath sounds equal, respirations unlabored. Cardiovascular.  Regular, nl S1, S2; Murmur present.  gallops or rubs.  No lower extremity edema, 2+ peripheral pulses.  Assessment/Plan:   Kristy Cox was seen today for hypertension.  Diagnoses and all orders for this visit:  Primary hypertension -     ECG 12 lead -     amLODIPine  (NORVASC ) 2.5 mg tablet; Take 1 tablet (2.5 mg total) by mouth daily.   - BP extremely elevated. Checked 3 times in clinic. Highest was 202/98, but ranging in 170/90 range. No signs of end organ damage. Start amlodipine  2.5mg , Continue Lisinopril -hydrochlorothiazide  20-12.5mg  daily  -Has been to the ED several times over the past few week for BP.  -EKG- showed bradycardia, T wave abnormality  - Will try to get echo done sooner by Cardiology.  - Continue to reduce sodium in diet.  - Diet low in refined carbohydrates (bread/rice/pasta/potatoes) and sugars (sweets, sugary beverages) as well as unhealthy fats (fried foods, many of the processed/prepackaged snack foods) and regular exercise recommended. - Goal blood pressure ideally <140/90 -Advised to send portal message with BP this evening.  -F/U next week   The patient verbalizes understanding and in agreement with the above plan. All  questions answered.  Current Outpatient Medications  Medication Sig Dispense Refill  . acetaminophen  (TYLENOL ) 500 mg tablet Take 1,000 mg by mouth every 4 (four) hours as needed.    . atorvastatin  (LIPITOR) 40 mg tablet TAKE 1 TABLET(40 MG) BY MOUTH 1 TIME A WEEK 12 tablet 2  . dorzolamide-timoloL  (COSOPT) 22.3-6.8 mg/mL ophthalmic solution Administer 1 drop into each eyes 2 (two) times a day. 10 mL 11  . ferrous sulfate  325 mg (65 mg iron) EC tablet Take 325 mg by mouth daily with breakfast.    . latanoprost  (XALATAN ) 0.005 % ophthalmic  solution INSTILL 1 DROP INTO BOTH EYES EVERY NIGHT 10 mL 11  . lisinopriL -hydroCHLOROthiazide  (PRINZIDE ) 20-12.5 mg per tablet Take 1 tablet by mouth daily. 30 tablet 0  . microfibrillar collagen (Avitene Flour) powder Apply 1 g topically as needed.    . amLODIPine  (NORVASC ) 2.5 mg tablet Take 1 tablet (2.5 mg total) by mouth daily. 30 tablet 0   No current facility-administered medications for this visit.    Medication side effects discussed with patient. Advised patient to call clinic or return forvisit if these symptoms occur.   Goals of care discussed with patient including med compliance and adequate follow up.  No follow-ups on file.    This document was created using the aid of voicerecognition Scientist, clinical (histocompatibility and immunogenetics).   Tari ONEIDA Banter, PA-C  This document serves as a record of services personally performed by Tari Banter PA-C.  It was created on their behalf by Delon Crooked, CMA, a trained medical scribe, and Certified Medical Assistant (CMA). During the course of documenting the history, physical exam and medical decision making, I was functioning as a stage manager. The creation of this record is the provider's dictation and/or activities during the visit.  Electronically signed by Delon Crooked, CMA 05/22/2023 3:51 PM  I agree the documentation is accurate and complete.  Electronically signed by: Tari ONEIDA Banter, PA-C 05/26/2023 2:16 PM

## 2023-11-01 NOTE — Progress Notes (Signed)
 Sent message, via epic in basket, requesting orders in epic from [facility]ers adviser.

## 2023-11-06 NOTE — Progress Notes (Signed)
 Second request for pre op orders in [facility]: Left voicemail for [place].

## 2023-11-07 NOTE — Patient Instructions (Signed)
 SURGICAL WAITING ROOM VISITATION  Patients having surgery or a procedure may have no more than 2 support people in the waiting area - these visitors may rotate.    Children under the age of 59 must have an adult with them who is not the patient.  Visitors with respiratory illnesses are discouraged from visiting and should remain at home.  If the patient needs to stay at the hospital during part of their recovery, the visitor guidelines for inpatient rooms apply. Pre-op nurse will coordinate an appropriate time for 1 support person to accompany patient in pre-op.  This support person may not rotate.    Please refer to the [facility] website for the visitor guidelines for Inpatients (after your surgery is over and you are in a regular room).       Your procedure is scheduled on: 11/18/23   Report to [facility] Main Entrance    Report to admitting at : 7:30 AM   Call this number if you have problems the morning of surgery [phone]   Do not eat food :After Midnight.   After Midnight you may have the following liquids until : 7:00 AM DAY OF SURGERY  Water Non-Citrus Juices (without pulp, NO RED-Apple, White grape, White cranberry) Black Coffee (NO MILK/CREAM OR CREAMERS, sugar ok)  Clear Tea (NO MILK/CREAM OR CREAMERS, sugar ok) regular and decaf                             Plain Jell-O (NO RED)                                           Fruit ices (not with fruit pulp, NO RED)                                     Popsicles (NO RED)                                                               Sports drinks like Gatorade (NO RED)   The day of surgery:  Drink ONE (1) Pre-Surgery Clear Ensure at : 7:00 AM the morning of surgery. Drink in one sitting. Do not sip.  This drink was given to you during your hospital  pre-op appointment visit. Nothing else to drink after completing the  Pre-Surgery Clear Ensure or G2.          If you have questions, please contact your  surgeon's office.  FOLLOW ANY ADDITIONAL PRE OP INSTRUCTIONS YOU RECEIVED FROM YOUR SURGEON'S OFFICE!!!  Oral Hygiene is also important to reduce your risk of infection.                                    Remember - BRUSH YOUR TEETH THE MORNING OF SURGERY WITH YOUR REGULAR TOOTHPASTE  DENTURES WILL BE REMOVED PRIOR TO SURGERY PLEASE DO NOT APPLY Poly grip OR ADHESIVES!!!   Do NOT smoke after Midnight   Stop all vitamins  and herbal supplements 7 days before surgery.   Take these medicines the morning of surgery with A SIP OF WATER: amlodipine .Use eye drops as usual.Tylenol  as needed.                              You may not have any metal on your body including hair pins, jewelry, and body piercing             Do not wear make-up, lotions, powders, perfumes/cologne, or deodorant  Do not wear nail polish including gel and S&S, artificial/acrylic nails, or any other type of covering on natural nails including finger and toenails. If you have artificial nails, gel coating, etc. that needs to be removed by a nail salon please have this removed prior to surgery or surgery may need to be canceled/ delayed if the surgeon/ anesthesia feels like they are unable to be safely monitored.   Do not shave  48 hours prior to surgery.    Do not bring valuables to the hospital.  IS NOT             RESPONSIBLE   FOR VALUABLES.   Contacts, glasses, dentures or bridgework may not be worn into surgery.   Bring small overnight bag day of surgery.   DO NOT BRING YOUR HOME MEDICATIONS TO THE HOSPITAL. PHARMACY WILL DISPENSE MEDICATIONS LISTED ON YOUR MEDICATION LIST TO YOU DURING YOUR ADMISSION IN THE HOSPITAL!    Patients discharged on the day of surgery will not be allowed to drive home.  Someone NEEDS to stay with you for the first 24 hours after anesthesia.   Special Instructions: Bring a copy of your healthcare power of attorney and living will documents the day of surgery if you haven't  scanned them before.              Please read over the following fact sheets you were given: IF YOU HAVE QUESTIONS ABOUT YOUR PRE-OP INSTRUCTIONS PLEASE CALL [phone].   If you received a COVID test during your pre-op visit  it is requested that you wear a mask when out in public, stay away from anyone that may not be feeling well and notify your surgeon if you develop symptoms. If you test positive for Covid or have been in contact with anyone that has tested positive in the last 10 days please notify you surgeon.      Pre-operative 5 CHG Bath Instructions   You can play a key role in reducing the risk of infection after surgery. Your skin needs to be as free of germs as possible. You can reduce the number of germs on your skin by washing with CHG (chlorhexidine gluconate) soap before surgery. CHG is an antiseptic soap that kills germs and continues to kill germs even after washing.   DO NOT use if you have an allergy to chlorhexidine/CHG or antibacterial soaps. If your skin becomes reddened or irritated, stop using the CHG and notify one of our RNs at [phone].   Please shower with the CHG soap starting 4 days before surgery using the following schedule:     Please keep in mind the following:  DO NOT shave, including legs and underarms, starting the day of your first shower.   You may shave your face at any point before/day of surgery.  Place clean sheets on your bed the day you start using CHG soap. Use a clean washcloth (not used since  being washed) for each shower. DO NOT sleep with pets once you start using the CHG.   CHG Shower Instructions:  If you choose to wash your hair and private area, wash first with your normal shampoo/soap.  After you use shampoo/soap, rinse your hair and body thoroughly to remove shampoo/soap residue.  Turn the water OFF and apply about 3 tablespoons (45 ml) of CHG soap to a CLEAN washcloth.  Apply CHG soap ONLY FROM YOUR NECK DOWN TO YOUR TOES  (washing for 3-5 minutes)  DO NOT use CHG soap on face, private areas, open wounds, or sores.  Pay special attention to the area where your surgery is being performed.  If you are having back surgery, having someone wash your back for you may be helpful. Wait 2 minutes after CHG soap is applied, then you may rinse off the CHG soap.  Pat dry with a clean towel  Put on clean clothes/pajamas   If you choose to wear lotion, please use ONLY the CHG-compatible lotions on the back of this paper.     Additional instructions for the day of surgery: DO NOT APPLY any lotions, deodorants, cologne, or perfumes.   Put on clean/comfortable clothes.  Brush your teeth.  Ask your nurse before applying any prescription medications to the skin.   CHG Compatible Lotions   Aveeno Moisturizing lotion  Cetaphil Moisturizing Cream  Cetaphil Moisturizing Lotion  Clairol Herbal Essence Moisturizing Lotion, Dry Skin  Clairol Herbal Essence Moisturizing Lotion, Extra Dry Skin  Clairol Herbal Essence Moisturizing Lotion, Normal Skin  Curel Age Defying Therapeutic Moisturizing Lotion with Alpha Hydroxy  Curel Extreme Care Body Lotion  Curel Soothing Hands Moisturizing Hand Lotion  Curel Therapeutic Moisturizing Cream, Fragrance-Free  Curel Therapeutic Moisturizing Lotion, Fragrance-Free  Curel Therapeutic Moisturizing Lotion, Original Formula  Eucerin Daily Replenishing Lotion  Eucerin Dry Skin Therapy Plus Alpha Hydroxy Crme  Eucerin Dry Skin Therapy Plus Alpha Hydroxy Lotion  Eucerin Original Crme  Eucerin Original Lotion  Eucerin Plus Crme Eucerin Plus Lotion  Eucerin TriLipid Replenishing Lotion  Keri Anti-Bacterial Hand Lotion  Keri Deep Conditioning Original Lotion Dry Skin Formula Softly Scented  Keri Deep Conditioning Original Lotion, Fragrance Free Sensitive Skin Formula  Keri Lotion Fast Absorbing Fragrance Free Sensitive Skin Formula  Keri Lotion Fast Absorbing Softly Scented Dry Skin  Formula  Keri Original Lotion  Keri Skin Renewal Lotion Keri Silky Smooth Lotion  Keri Silky Smooth Sensitive Skin Lotion  Nivea Body Creamy Conditioning Oil  Nivea Body Extra Enriched Lotion  Nivea Body Original Lotion  Nivea Body Sheer Moisturizing Lotion Nivea Crme  Nivea Skin Firming Lotion  NutraDerm 30 Skin Lotion  NutraDerm Skin Lotion  NutraDerm Therapeutic Skin Cream  NutraDerm Therapeutic Skin Lotion  ProShield Protective Hand Cream  Provon moisturizing lotion   Incentive Spirometer  An incentive spirometer is a tool that can help keep your lungs clear and active. This tool measures how well you are filling your lungs with each breath. Taking long deep breaths may help reverse or decrease the chance of developing breathing (pulmonary) problems (especially infection) following: A long period of time when you are unable to move or be active. BEFORE THE PROCEDURE  If the spirometer includes an indicator to show your best effort, your nurse or respiratory therapist will set it to a desired goal. If possible, sit up straight or lean slightly forward. Try not to slouch. Hold the incentive spirometer in an upright position. INSTRUCTIONS FOR USE  Sit on the edge of  your bed if possible, or sit up as far as you can in bed or on a chair. Hold the incentive spirometer in an upright position. Breathe out normally. Place the mouthpiece in your mouth and seal your lips tightly around it. Breathe in slowly and as deeply as possible, raising the piston or the ball toward the top of the column. Hold your breath for 3-5 seconds or for as long as possible. Allow the piston or ball to fall to the bottom of the column. Remove the mouthpiece from your mouth and breathe out normally. Rest for a few seconds and repeat Steps 1 through 7 at least 10 times every 1-2 hours when you are awake. Take your time and take a few normal breaths between deep breaths. The spirometer may include an indicator to  show your best effort. Use the indicator as a goal to work toward during each repetition. After each set of 10 deep breaths, practice coughing to be sure your lungs are clear. If you have an incision (the cut made at the time of surgery), support your incision when coughing by placing a pillow or rolled up towels firmly against it. Once you are able to get out of bed, walk around indoors and cough well. You may stop using the incentive spirometer when instructed by your caregiver.  RISKS AND COMPLICATIONS Take your time so you do not get dizzy or light-headed. If you are in pain, you may need to take or ask for pain medication before doing incentive spirometry. It is harder to take a deep breath if you are having pain. AFTER USE Rest and breathe slowly and easily. It can be helpful to keep track of a log of your progress. Your caregiver can provide you with a simple table to help with this. If you are using the spirometer at home, follow these instructions: SEEK MEDICAL CARE IF:  You are having difficultly using the spirometer. You have trouble using the spirometer as often as instructed. Your pain medication is not giving enough relief while using the spirometer. You develop fever of 100.5 F (38.1 C) or higher. SEEK IMMEDIATE MEDICAL CARE IF:  You cough up bloody sputum that had not been present before. You develop fever of 102 F (38.9 C) or greater. You develop worsening pain at or near the incision site. MAKE SURE YOU:  Understand these instructions. Will watch your condition. Will get help right away if you are not doing well or get worse. Document Released: 08/13/2006 Document Revised: 06/25/2011 Document Reviewed: 10/14/2006 [facility] Patient Information 2014 [place], [place].   ________________________________________________________________________

## 2023-11-07 NOTE — Progress Notes (Signed)
 For Anesthesia: PCP - Tammy Tari DASEN, PA-C   Cardiologist - Terryann Malvin Job, MD  ONC:Ijcpd B Younts: NP: 09/11/23 Bowel Prep reminder:  Chest x-ray - CT angio: 09/20/18 EKG - 05/24/23: CEW: requested. Stress Test -  ECHO - 06/06/23 Cardiac Cath -  Pacemaker/ICD device last checked: Pacemaker orders received: Device Rep notified:  Spinal Cord Stimulator:N/A  Sleep Study - Yes CPAP - NO  Fasting Blood Sugar - N/A Checks Blood Sugar _____ times a day Date and result of last Hgb A1c-  Last dose of GLP1 agonist- N/A GLP1 instructions:   Last dose of SGLT-2 inhibitors- N/A SGLT-2 instructions:   Blood Thinner Instructions:N/A Aspirin  Instructions: Last Dose:  Activity level: Can go up a flight of stairs and activities of daily living without stopping and without chest pain and/or shortness of breath   Able to exercise without chest pain and/or shortness of breath  Anesthesia review: Yk:[id number]; supraventricular tachycardia;PVC's,OSA(NO CPAP)  Patient denies shortness of breath, fever, cough and chest pain at PAT appointment   Patient verbalized understanding of instructions that were reviewed over the telephone.

## 2023-11-08 ENCOUNTER — Other Ambulatory Visit: Payer: Self-pay

## 2023-11-08 ENCOUNTER — Encounter (HOSPITAL_COMMUNITY): Payer: Self-pay

## 2023-11-08 ENCOUNTER — Encounter (HOSPITAL_COMMUNITY)
Admission: RE | Admit: 2023-11-08 | Discharge: 2023-11-08 | Disposition: A | Discharge status: Home / Self Care | Source: Ambulatory Visit | Attending: Orthopedic Surgery | Admitting: Orthopedic Surgery

## 2023-11-08 VITALS — BP 141/71 | HR 50 | Temp 98.3°F | Ht 64.0 in | Wt 147.0 lb

## 2023-11-08 DIAGNOSIS — K219 Gastro-esophageal reflux disease without esophagitis: Secondary | ICD-10-CM | POA: Insufficient documentation

## 2023-11-08 DIAGNOSIS — Z01812 Encounter for preprocedural laboratory examination: Secondary | ICD-10-CM | POA: Insufficient documentation

## 2023-11-08 DIAGNOSIS — I10 Essential (primary) hypertension: Secondary | ICD-10-CM | POA: Insufficient documentation

## 2023-11-08 DIAGNOSIS — I4719 Other supraventricular tachycardia: Secondary | ICD-10-CM | POA: Diagnosis not present

## 2023-11-08 DIAGNOSIS — I371 Nonrheumatic pulmonary valve insufficiency: Secondary | ICD-10-CM | POA: Insufficient documentation

## 2023-11-08 DIAGNOSIS — M1712 Unilateral primary osteoarthritis, left knee: Secondary | ICD-10-CM | POA: Diagnosis not present

## 2023-11-08 DIAGNOSIS — Z01818 Encounter for other preprocedural examination: Secondary | ICD-10-CM | POA: Diagnosis present

## 2023-11-08 DIAGNOSIS — D649 Anemia, unspecified: Secondary | ICD-10-CM | POA: Insufficient documentation

## 2023-11-08 HISTORY — DX: Unspecified osteoarthritis, unspecified site: M19.90

## 2023-11-08 HISTORY — DX: Cardiac arrhythmia, unspecified: I49.9

## 2023-11-08 HISTORY — DX: Anemia, unspecified: D64.9

## 2023-11-08 HISTORY — DX: Pneumonia, unspecified organism: J18.9

## 2023-11-08 LAB — BASIC METABOLIC PANEL WITH GFR
Anion gap: 10 (ref 5–15)
BUN: 15 mg/dL (ref 8–23)
CO2: 27 mmol/L (ref 22–32)
Calcium: 9.7 mg/dL (ref 8.9–10.3)
Chloride: 104 mmol/L (ref 98–111)
Creatinine, Ser: 0.79 mg/dL (ref 0.44–1.00)
GFR, Estimated: >60 mL/min (ref >60)
Glucose, Bld: 105 mg/dL — ABNORMAL HIGH (ref 70–99)
Potassium: 3.5 mmol/L (ref 3.5–5.1)
Sodium: 141 mmol/L (ref 135–145)

## 2023-11-08 LAB — SURGICAL PCR SCREEN
MRSA, PCR: NEGATIVE
Staphylococcus aureus: NEGATIVE

## 2023-11-08 LAB — CBC
HCT: 37.1 % (ref 36.0–46.0)
Hemoglobin: 11.7 g/dL — ABNORMAL LOW (ref 12.0–15.0)
MCH: 31 pg (ref 26.0–34.0)
MCHC: 31.5 g/dL (ref 30.0–36.0)
MCV: 98.1 fL (ref 80.0–100.0)
Platelets: 194 K/uL (ref 150–400)
RBC: 3.78 MIL/uL — ABNORMAL LOW (ref 3.87–5.11)
RDW: 13.5 % (ref 11.5–15.5)
WBC: 3.3 K/uL — ABNORMAL LOW (ref 4.0–10.5)
nRBC: 0 % (ref 0.0–0.2)

## 2023-11-12 ENCOUNTER — Encounter (HOSPITAL_COMMUNITY): Payer: Self-pay

## 2023-11-12 ENCOUNTER — Other Ambulatory Visit: Payer: Self-pay | Admitting: Orthopedic Surgery

## 2023-11-12 DIAGNOSIS — G8929 Other chronic pain: Secondary | ICD-10-CM

## 2023-11-12 NOTE — Progress Notes (Signed)
 Case: [id number] Date/Time: 11/18/23 0956   Procedure: ARTHROPLASTY, KNEE, TOTAL (Left: Knee)   Anesthesia type: Spinal   Diagnosis: Primary osteoarthritis of left knee [M17.12]   Pre-op diagnosis: Primary osteoarthritis of left knee M17.12   Location: [facility] ROOM 06 / [facility]   Surgeons: Rubie Kemps, MD       DISCUSSION: Kristy Cox is a 78 yo female with PMH of HTN, atrial tachycardia, mild-mod PR, GERD, anemia, arthritis.  Pt had eye surgery in 05/2023 which was uncomplicated. Airway not difficult.   Patient follows with Cardiology at [facility] for chronic DOE and palpitations. Last seen in clinic on 09/11/23. Cardiac symptoms stable. Echo in 05/2023 showed normal LVEF with mild-mod PR. Advised f/u in 1 year.  Last seen by PCP on 09/16/23 for routine f/u. All issues stable/controlled.  VS: BP (!) 141/71   Pulse (!) 50   Temp 36.8 C (Oral)   Ht 5' 4 (1.626 m)   Wt 66.7 kg   SpO2 100%   BMI 25.23 kg/m   PROVIDERS: Tammy Tari DASEN, PA-C   LABS: Labs reviewed: Acceptable for surgery. Mild chronic leukopenia, anemia. (all labs ordered are listed, but only abnormal results are displayed)  Labs Reviewed  BASIC METABOLIC PANEL WITH GFR - Abnormal; Notable for the following components:      Result Value   Glucose, Bld 105 (*)    All other components within normal limits  CBC - Abnormal; Notable for the following components:   WBC 3.3 (*)    RBC 3.78 (*)    Hemoglobin 11.7 (*)    All other components within normal limits  SURGICAL PCR SCREEN     IMAGES:   EKG 04/20/23:  Sinus bradycardia with sinus arrhythmia, rate 52 Moderate voltage criteria for LVH, may be normal variant ( R in aVL , Cornell product ) Nonspecific T wave abnormality Abnormal ECG  CV:  Echo 06/11/23 ([facility]):  PROCEDURE A two-dimensional transthoracic echocardiogram with color flow and Doppler was performed. Image Quality  Technically adequate. There is mild concentric left ventricular  hypertrophy with normal wall motion, normal systolic function and ejection fraction  55-60% . The right ventricle is normal in size and function. The left atrium is moderately dilated. Right atrial size is normal. The aortic valve is normal in structure and function. There is mild pulmonic valve thickening. There is trace mitral regurgitation. There is trace tricuspid regurgitation. No pulmonary hypertension. Mild to moderate pulmonic valvular regurgitation. Normal IVC size and collapsibility; Right atrial pressure is estimated to be 3 mm Hg. There is no pericardial effusion. There is no significant change compared to prior study in 2022  Past Medical History:  Diagnosis Date   Acid reflux    Anemia    Arthritis    Dysrhythmia    Hypercholesteremia    Hypertension    Irregular heart beat    PVC's   Osteoporosis    Pneumonia     Past Surgical History:  Procedure Laterality Date   ARTHROPLASTY Right    CYSTECTOMY     back   EYE SURGERY      MEDICATIONS:  acetaminophen  (TYLENOL ) 500 MG tablet   amLODipine  (NORVASC ) 2.5 MG tablet   atorvastatin (LIPITOR) 40 MG tablet   COLLAGEN PO   ferrous sulfate 325 (65 FE) MG EC tablet   latanoprost  (XALATAN ) 0.005 % ophthalmic solution   lisinopril  (ZESTRIL ) 10 MG tablet   lisinopril -hydrochlorothiazide  (ZESTORETIC) 20-12.5 MG tablet   meclizine  (ANTIVERT ) 25 MG tablet  Multiple Vitamins-Minerals (WOMENS MULTIVITAMIN) TABS   pantoprazole  (PROTONIX ) 40 MG tablet   simvastatin  (ZOCOR ) 40 MG tablet   timolol (TIMOPTIC) 0.5 % ophthalmic solution   No current facility-administered medications for this encounter.   Burnard CHRISTELLA Odis DEVONNA [facility] Short Stay/Anesthesiology [facility] Phone [phone] 11/12/2023 11:01 AM

## 2023-11-12 NOTE — Anesthesia Preprocedure Evaluation (Addendum)
 Anesthesia Evaluation  Patient identified by MRN, date of birth, ID band Patient awake    Reviewed: Allergy & Precautions, H&P , NPO status , Patient's Chart, lab work & pertinent test results  Airway Mallampati: II  TM Distance: >3 FB Neck ROM: Full    Dental no notable dental hx. (+) Teeth Intact, Missing, Dental Advisory Given,    Pulmonary neg pulmonary ROS, pneumonia   Pulmonary exam normal breath sounds clear to auscultation       Cardiovascular hypertension, Pt. on medications negative cardio ROS Normal cardiovascular exam+ dysrhythmias  Rhythm:Regular Rate:Normal     Neuro/Psych negative neurological ROS  negative psych ROS   GI/Hepatic negative GI ROS, Neg liver ROS,GERD  Medicated,,  Endo/Other  negative endocrine ROS    Renal/GU negative Renal ROS  negative genitourinary   Musculoskeletal negative musculoskeletal ROS (+) Arthritis ,    Abdominal   Peds negative pediatric ROS (+)  Hematology negative hematology ROS (+) Blood dyscrasia, anemia   Anesthesia Other Findings   Reproductive/Obstetrics negative OB ROS                              Anesthesia Physical Anesthesia Plan  ASA: 2  Anesthesia Plan: General   Post-op Pain Management: Tylenol  PO (pre-op)*, Celebrex  PO (pre-op)* and Gabapentin  PO (pre-op)*   Induction: Intravenous  PONV Risk Score and Plan: 3 and Ondansetron , Dexamethasone  and Treatment may vary due to age or medical condition  Airway Management Planned: LMA  Additional Equipment: None  Intra-op Plan:   Post-operative Plan: Extubation in OR  Informed Consent: I have reviewed the patients History and Physical, chart, labs and discussed the procedure including the risks, benefits and alternatives for the proposed anesthesia with the patient or authorized representative who has indicated his/her understanding and acceptance.     Dental advisory  given  Plan Discussed with: CRNA, Surgeon and Anesthesiologist  Anesthesia Plan Comments: (See PAT note from 7/25DISCUSSION: Kristy Cox is a 78 yo female with PMH of HTN, atrial tachycardia, mild-mod PR, GERD, anemia, arthritis.   Pt had eye surgery in 05/2023 which was uncomplicated. Airway not difficult.    Patient follows with Cardiology at [facility] for chronic DOE and palpitations. Last seen in clinic on 09/11/23. Cardiac symptoms stable. Echo in 05/2023 showed normal LVEF with mild-mod PR. Advised f/u in 1 year.    EKG 04/20/23:   Sinus bradycardia with sinus arrhythmia, rate 52 Moderate voltage criteria for LVH, may be normal variant ( R in aVL , Cornell product ) Nonspecific T wave abnormality Abnormal ECG   CV:   Echo 06/11/23 ([facility]):   PROCEDURE A two-dimensional transthoracic echocardiogram with color flow and Doppler was performed. Image Quality  Technically adequate. There is mild concentric left ventricular hypertrophy with normal wall motion, normal systolic function and ejection fraction  55-60% . The right ventricle is normal in size and function. The left atrium is moderately dilated. Right atrial size is normal. The aortic valve is normal in structure and function. There is mild pulmonic valve thickening. There is trace mitral regurgitation. There is trace tricuspid regurgitation. No pulmonary hypertension. Mild to moderate pulmonic valvular regurgitation. Normal IVC size and collapsibility; Right atrial pressure is estimated to be 3 mm Hg. There is no pericardial effusion. There is no significant change compared to prior study in 2022    )         Anesthesia Quick Evaluation

## 2023-11-18 ENCOUNTER — Encounter (HOSPITAL_COMMUNITY): Payer: Self-pay | Admitting: Orthopedic Surgery

## 2023-11-18 ENCOUNTER — Ambulatory Visit (HOSPITAL_BASED_OUTPATIENT_CLINIC_OR_DEPARTMENT_OTHER): Payer: Self-pay | Admitting: Anesthesiology

## 2023-11-18 ENCOUNTER — Ambulatory Visit (HOSPITAL_COMMUNITY): Payer: Self-pay | Admitting: Physician Assistant

## 2023-11-18 ENCOUNTER — Encounter (HOSPITAL_COMMUNITY)
Admission: RE | Disposition: A | Discharge status: Home / Self Care | Payer: Self-pay | Source: Home / Self Care | Attending: Orthopedic Surgery

## 2023-11-18 ENCOUNTER — Observation Stay (HOSPITAL_COMMUNITY)
Admission: RE | Admit: 2023-11-18 | Discharge: 2023-11-19 | Disposition: A | Discharge status: Home / Self Care | Attending: Orthopedic Surgery | Admitting: Orthopedic Surgery

## 2023-11-18 ENCOUNTER — Other Ambulatory Visit: Payer: Self-pay

## 2023-11-18 DIAGNOSIS — M25562 Pain in left knee: Secondary | ICD-10-CM | POA: Diagnosis present

## 2023-11-18 DIAGNOSIS — Z96652 Presence of left artificial knee joint: Secondary | ICD-10-CM | POA: Diagnosis not present

## 2023-11-18 DIAGNOSIS — Z7982 Long term (current) use of aspirin: Secondary | ICD-10-CM | POA: Diagnosis not present

## 2023-11-18 DIAGNOSIS — M1712 Unilateral primary osteoarthritis, left knee: Secondary | ICD-10-CM

## 2023-11-18 DIAGNOSIS — I10 Essential (primary) hypertension: Secondary | ICD-10-CM | POA: Insufficient documentation

## 2023-11-18 HISTORY — PX: TOTAL KNEE ARTHROPLASTY: SHX125

## 2023-11-18 SURGERY — ARTHROPLASTY, KNEE, TOTAL
Anesthesia: General | Site: Knee | Laterality: Left

## 2023-11-18 MED ORDER — ONDANSETRON HCL 4 MG/2ML IJ SOLN: 4.0000 mg | Freq: Once | INTRAMUSCULAR | Status: DC | PRN

## 2023-11-18 MED ORDER — EPHEDRINE 5 MG/ML INJ
INTRAVENOUS | Status: AC
  Filled 2023-11-18: qty 5

## 2023-11-18 MED ORDER — FLEET ENEMA RE ENEM: 1.0000 | ENEMA | Freq: Once | RECTAL | Status: DC | PRN

## 2023-11-18 MED ORDER — ATORVASTATIN CALCIUM 40 MG PO TABS: 40.0000 mg | ORAL_TABLET | ORAL | Status: DC

## 2023-11-18 MED ORDER — FENTANYL CITRATE (PF) 100 MCG/2ML IJ SOLN
INTRAMUSCULAR | Status: AC
  Filled 2023-11-18: qty 2

## 2023-11-18 MED ORDER — FENTANYL CITRATE PF 50 MCG/ML IJ SOSY
PREFILLED_SYRINGE | INTRAMUSCULAR | Status: AC
Start: 2023-11-18 — End: 2023-11-18
  Filled 2023-11-18: qty 1

## 2023-11-18 MED ORDER — OXYCODONE HCL 5 MG/5ML PO SOLN: 5.0000 mg | Freq: Once | ORAL | Status: DC | PRN

## 2023-11-18 MED ORDER — BUPIVACAINE-EPINEPHRINE (PF) 0.5% -1:200000 IJ SOLN
INTRAMUSCULAR | Status: DC | PRN
  Administered 2023-11-18: 20 mL via PERINEURAL

## 2023-11-18 MED ORDER — LACTATED RINGERS IV BOLUS: 250.0000 mL | Freq: Once | INTRAVENOUS | Status: DC

## 2023-11-18 MED ORDER — TIMOLOL MALEATE 0.5 % OP SOLN
1.0000 [drp] | Freq: Two times a day (BID) | OPHTHALMIC | Status: DC
  Administered 2023-11-18 – 2023-11-19 (×2): 1 [drp] via OPHTHALMIC
  Filled 2023-11-18: qty 5

## 2023-11-18 MED ORDER — CEFAZOLIN SODIUM-DEXTROSE 2-4 GM/100ML-% IV SOLN
2.0000 g | INTRAVENOUS | Status: AC
  Administered 2023-11-18: 2 g via INTRAVENOUS
  Filled 2023-11-18: qty 100

## 2023-11-18 MED ORDER — AMLODIPINE BESYLATE 5 MG PO TABS
2.5000 mg | ORAL_TABLET | Freq: Every day | ORAL | Status: DC
  Administered 2023-11-19: 2.5 mg via ORAL
  Filled 2023-11-18: qty 1

## 2023-11-18 MED ORDER — METHOCARBAMOL 1000 MG/10ML IJ SOLN
500.0000 mg | Freq: Four times a day (QID) | INTRAMUSCULAR | Status: DC | PRN
Start: 2023-11-18 — End: 2023-11-19
  Administered 2023-11-18: 500 mg via INTRAVENOUS
  Filled 2023-11-18: qty 10

## 2023-11-18 MED ORDER — ORAL CARE MOUTH RINSE: 15.0000 mL | Freq: Once | OROMUCOSAL | Status: AC

## 2023-11-18 MED ORDER — MENTHOL 3 MG MT LOZG: 1.0000 | LOZENGE | OROMUCOSAL | Status: DC | PRN

## 2023-11-18 MED ORDER — TRANEXAMIC ACID-NACL 1000-0.7 MG/100ML-% IV SOLN
1000.0000 mg | INTRAVENOUS | Status: AC
  Administered 2023-11-18: 1000 mg via INTRAVENOUS
  Filled 2023-11-18: qty 100

## 2023-11-18 MED ORDER — SENNOSIDES-DOCUSATE SODIUM 8.6-50 MG PO TABS: 1.0000 | ORAL_TABLET | Freq: Every evening | ORAL | Status: DC | PRN

## 2023-11-18 MED ORDER — BUPIVACAINE LIPOSOME 1.3 % IJ SUSP
INTRAMUSCULAR | Status: AC
  Filled 2023-11-18: qty 20

## 2023-11-18 MED ORDER — GABAPENTIN 300 MG PO CAPS
300.0000 mg | ORAL_CAPSULE | Freq: Once | ORAL | Status: AC
  Administered 2023-11-18: 300 mg via ORAL
  Filled 2023-11-18: qty 1

## 2023-11-18 MED ORDER — KETAMINE HCL 50 MG/5ML IJ SOSY
PREFILLED_SYRINGE | INTRAMUSCULAR | Status: AC
  Filled 2023-11-18: qty 5

## 2023-11-18 MED ORDER — ONDANSETRON HCL 4 MG/2ML IJ SOLN
INTRAMUSCULAR | Status: AC
  Filled 2023-11-18: qty 2

## 2023-11-18 MED ORDER — MEPERIDINE HCL 25 MG/ML IJ SOLN: 6.2500 mg | INTRAMUSCULAR | Status: DC | PRN

## 2023-11-18 MED ORDER — CHLORHEXIDINE GLUCONATE 0.12 % MT SOLN
15.0000 mL | Freq: Once | OROMUCOSAL | Status: AC
  Administered 2023-11-18: 15 mL via OROMUCOSAL

## 2023-11-18 MED ORDER — SODIUM CHLORIDE 0.9 % IV SOLN: INTRAVENOUS | Status: DC

## 2023-11-18 MED ORDER — METHOCARBAMOL 500 MG PO TABS: 500.0000 mg | ORAL_TABLET | Freq: Four times a day (QID) | ORAL | 0 refills | Status: AC

## 2023-11-18 MED ORDER — ONDANSETRON HCL 4 MG/2ML IJ SOLN
4.0000 mg | INTRAMUSCULAR | Status: AC
  Administered 2023-11-18: 4 mg via INTRAVENOUS

## 2023-11-18 MED ORDER — BUPIVACAINE-EPINEPHRINE 0.25% -1:200000 IJ SOLN
INTRAMUSCULAR | Status: DC | PRN
  Administered 2023-11-18: 30 mL

## 2023-11-18 MED ORDER — HYDROCHLOROTHIAZIDE 12.5 MG PO TABS
12.5000 mg | ORAL_TABLET | Freq: Every day | ORAL | Status: DC
  Administered 2023-11-19: 12.5 mg via ORAL
  Filled 2023-11-18 (×2): qty 1

## 2023-11-18 MED ORDER — ASPIRIN 81 MG PO TBEC: 81.0000 mg | DELAYED_RELEASE_TABLET | Freq: Two times a day (BID) | ORAL | 0 refills | Status: AC

## 2023-11-18 MED ORDER — ONDANSETRON HCL 4 MG/2ML IJ SOLN: INTRAMUSCULAR | Status: DC | PRN

## 2023-11-18 MED ORDER — KETAMINE HCL 50 MG/5ML IJ SOSY
PREFILLED_SYRINGE | INTRAMUSCULAR | Status: DC | PRN
  Administered 2023-11-18: 20 mg via INTRAVENOUS

## 2023-11-18 MED ORDER — ONDANSETRON HCL 4 MG PO TABS: 4.0000 mg | ORAL_TABLET | Freq: Four times a day (QID) | ORAL | Status: DC | PRN

## 2023-11-18 MED ORDER — METOCLOPRAMIDE HCL 5 MG/ML IJ SOLN
5.0000 mg | Freq: Three times a day (TID) | INTRAMUSCULAR | Status: DC | PRN
  Administered 2023-11-18 – 2023-11-19 (×2): 10 mg via INTRAVENOUS
  Filled 2023-11-18 (×2): qty 2

## 2023-11-18 MED ORDER — PHENOL 1.4 % MT LIQD: 1.0000 | OROMUCOSAL | Status: DC | PRN

## 2023-11-18 MED ORDER — ACETAMINOPHEN 500 MG PO TABS
1000.0000 mg | ORAL_TABLET | Freq: Four times a day (QID) | ORAL | Status: DC
  Administered 2023-11-19 (×3): 1000 mg via ORAL
  Filled 2023-11-18 (×4): qty 2

## 2023-11-18 MED ORDER — SODIUM CHLORIDE (PF) 0.9 % IJ SOLN
INTRAMUSCULAR | Status: AC
  Filled 2023-11-18: qty 20

## 2023-11-18 MED ORDER — ASPIRIN 81 MG PO CHEW
81.0000 mg | CHEWABLE_TABLET | Freq: Two times a day (BID) | ORAL | Status: DC
  Administered 2023-11-19: 81 mg via ORAL
  Filled 2023-11-18: qty 1

## 2023-11-18 MED ORDER — DEXAMETHASONE SODIUM PHOSPHATE 10 MG/ML IJ SOLN
INTRAMUSCULAR | Status: DC | PRN
Start: 2023-11-18 — End: 2023-11-18
  Administered 2023-11-18: 6 mg via INTRAVENOUS

## 2023-11-18 MED ORDER — EPHEDRINE SULFATE-NACL 50-0.9 MG/10ML-% IV SOSY
PREFILLED_SYRINGE | INTRAVENOUS | Status: DC | PRN
  Administered 2023-11-18 (×2): 5 mg via INTRAVENOUS

## 2023-11-18 MED ORDER — FENTANYL CITRATE PF 50 MCG/ML IJ SOSY
100.0000 ug | PREFILLED_SYRINGE | INTRAMUSCULAR | Status: DC
  Filled 2023-11-18: qty 2

## 2023-11-18 MED ORDER — PROPOFOL 10 MG/ML IV BOLUS
INTRAVENOUS | Status: AC
  Filled 2023-11-18: qty 20

## 2023-11-18 MED ORDER — WATER FOR IRRIGATION, STERILE IR SOLN
Status: DC | PRN
  Administered 2023-11-18: 1000 mL

## 2023-11-18 MED ORDER — LISINOPRIL-HYDROCHLOROTHIAZIDE 20-12.5 MG PO TABS: 1.0000 | ORAL_TABLET | Freq: Every day | ORAL | Status: DC

## 2023-11-18 MED ORDER — SODIUM CHLORIDE (PF) 0.9 % IJ SOLN
INTRAMUSCULAR | Status: DC | PRN
  Administered 2023-11-18: 20 mL

## 2023-11-18 MED ORDER — GLYCOPYRROLATE 0.2 MG/ML IJ SOLN
INTRAMUSCULAR | Status: AC
  Filled 2023-11-18: qty 1

## 2023-11-18 MED ORDER — POVIDONE-IODINE 10 % EX SWAB: 2.0000 | Freq: Once | CUTANEOUS | Status: DC

## 2023-11-18 MED ORDER — ONDANSETRON HCL 4 MG/2ML IJ SOLN
4.0000 mg | Freq: Four times a day (QID) | INTRAMUSCULAR | Status: DC | PRN
  Administered 2023-11-18: 4 mg via INTRAVENOUS
  Filled 2023-11-18: qty 2

## 2023-11-18 MED ORDER — PHENYLEPHRINE HCL-NACL 20-0.9 MG/250ML-% IV SOLN
INTRAVENOUS | Status: DC | PRN
  Administered 2023-11-18: 50 ug/min via INTRAVENOUS

## 2023-11-18 MED ORDER — SODIUM CHLORIDE 0.9 % IR SOLN
Status: DC | PRN
  Administered 2023-11-18: 1000 mL

## 2023-11-18 MED ORDER — ACETAMINOPHEN 500 MG PO TABS
1000.0000 mg | ORAL_TABLET | Freq: Once | ORAL | Status: AC
  Administered 2023-11-18: 1000 mg via ORAL
  Filled 2023-11-18: qty 2

## 2023-11-18 MED ORDER — ALUM & MAG HYDROXIDE-SIMETH 200-200-20 MG/5ML PO SUSP: 30.0000 mL | ORAL | Status: DC | PRN

## 2023-11-18 MED ORDER — METOCLOPRAMIDE HCL 5 MG PO TABS: 5.0000 mg | ORAL_TABLET | Freq: Three times a day (TID) | ORAL | Status: DC | PRN

## 2023-11-18 MED ORDER — OXYCODONE HCL 5 MG PO TABS: 5.0000 mg | ORAL_TABLET | Freq: Once | ORAL | Status: DC | PRN

## 2023-11-18 MED ORDER — LACTATED RINGERS IV SOLN: INTRAVENOUS | Status: DC

## 2023-11-18 MED ORDER — FENTANYL CITRATE PF 50 MCG/ML IJ SOSY
25.0000 ug | PREFILLED_SYRINGE | INTRAMUSCULAR | Status: DC | PRN
  Administered 2023-11-18 (×3): 50 ug via INTRAVENOUS

## 2023-11-18 MED ORDER — CELECOXIB 200 MG PO CAPS: 200.0000 mg | ORAL_CAPSULE | Freq: Once | ORAL | Status: DC

## 2023-11-18 MED ORDER — PROPOFOL 10 MG/ML IV BOLUS
INTRAVENOUS | Status: DC | PRN
  Administered 2023-11-18: 110 mg via INTRAVENOUS
  Administered 2023-11-18: 90 mg via INTRAVENOUS

## 2023-11-18 MED ORDER — ONDANSETRON HCL 4 MG/2ML IJ SOLN
INTRAMUSCULAR | Status: DC | PRN
  Administered 2023-11-18: 4 mg via INTRAVENOUS

## 2023-11-18 MED ORDER — FERROUS SULFATE 325 (65 FE) MG PO TBEC: 325.0000 mg | DELAYED_RELEASE_TABLET | Freq: Every day | ORAL | Status: DC

## 2023-11-18 MED ORDER — FENTANYL CITRATE PF 50 MCG/ML IJ SOSY
PREFILLED_SYRINGE | INTRAMUSCULAR | Status: AC
  Filled 2023-11-18: qty 1

## 2023-11-18 MED ORDER — DIPHENHYDRAMINE HCL 12.5 MG/5ML PO ELIX: 12.5000 mg | ORAL_SOLUTION | ORAL | Status: DC | PRN

## 2023-11-18 MED ORDER — GABAPENTIN 300 MG PO CAPS: 300.0000 mg | ORAL_CAPSULE | ORAL | Status: DC

## 2023-11-18 MED ORDER — GLYCOPYRROLATE 0.2 MG/ML IJ SOLN
INTRAMUSCULAR | Status: DC | PRN
Start: 2023-11-18 — End: 2023-11-18
  Administered 2023-11-18: .2 mg via INTRAVENOUS

## 2023-11-18 MED ORDER — MIDAZOLAM HCL 2 MG/2ML IJ SOLN: 2.0000 mg | INTRAMUSCULAR | Status: DC

## 2023-11-18 MED ORDER — DOCUSATE SODIUM 100 MG PO CAPS
100.0000 mg | ORAL_CAPSULE | Freq: Two times a day (BID) | ORAL | Status: DC
  Administered 2023-11-19: 100 mg via ORAL
  Filled 2023-11-18: qty 1

## 2023-11-18 MED ORDER — 0.9 % SODIUM CHLORIDE (POUR BTL) OPTIME
TOPICAL | Status: DC | PRN
  Administered 2023-11-18: 1000 mL

## 2023-11-18 MED ORDER — ONDANSETRON 4 MG PO TBDP: 4.0000 mg | ORAL_TABLET | Freq: Three times a day (TID) | ORAL | 0 refills | Status: AC | PRN

## 2023-11-18 MED ORDER — LISINOPRIL 20 MG PO TABS
20.0000 mg | ORAL_TABLET | Freq: Every day | ORAL | Status: DC
  Administered 2023-11-19: 20 mg via ORAL
  Filled 2023-11-18 (×2): qty 1

## 2023-11-18 MED ORDER — ZOLPIDEM TARTRATE 5 MG PO TABS: 5.0000 mg | ORAL_TABLET | Freq: Every evening | ORAL | Status: DC | PRN

## 2023-11-18 MED ORDER — BUPIVACAINE LIPOSOME 1.3 % IJ SUSP: 20.0000 mL | Freq: Once | INTRAMUSCULAR | Status: DC

## 2023-11-18 MED ORDER — HYDROMORPHONE HCL 1 MG/ML IJ SOLN
0.5000 mg | INTRAMUSCULAR | Status: DC | PRN
  Administered 2023-11-18: 1 mg via INTRAVENOUS
  Filled 2023-11-18: qty 1

## 2023-11-18 MED ORDER — BUPIVACAINE-EPINEPHRINE (PF) 0.25% -1:200000 IJ SOLN
INTRAMUSCULAR | Status: AC
  Filled 2023-11-18: qty 30

## 2023-11-18 MED ORDER — FENTANYL CITRATE (PF) 100 MCG/2ML IJ SOLN
INTRAMUSCULAR | Status: DC | PRN
  Administered 2023-11-18: 25 ug via INTRAVENOUS
  Administered 2023-11-18: 50 ug via INTRAVENOUS
  Administered 2023-11-18 (×5): 25 ug via INTRAVENOUS

## 2023-11-18 MED ORDER — CEFAZOLIN SODIUM-DEXTROSE 2-4 GM/100ML-% IV SOLN
2.0000 g | Freq: Four times a day (QID) | INTRAVENOUS | Status: AC
  Administered 2023-11-18 (×2): 2 g via INTRAVENOUS
  Filled 2023-11-18 (×2): qty 100

## 2023-11-18 MED ORDER — FERROUS SULFATE 325 (65 FE) MG PO TABS
325.0000 mg | ORAL_TABLET | Freq: Three times a day (TID) | ORAL | Status: DC
  Administered 2023-11-19 (×2): 325 mg via ORAL
  Filled 2023-11-18 (×3): qty 1

## 2023-11-18 MED ORDER — LIDOCAINE HCL (CARDIAC) PF 100 MG/5ML IV SOSY
PREFILLED_SYRINGE | INTRAVENOUS | Status: DC | PRN
  Administered 2023-11-18: 100 mg via INTRAVENOUS

## 2023-11-18 MED ORDER — OXYCODONE HCL 5 MG PO TABS: 5.0000 mg | ORAL_TABLET | Freq: Four times a day (QID) | ORAL | 0 refills | Status: AC | PRN

## 2023-11-18 MED ORDER — LIDOCAINE HCL (PF) 2 % IJ SOLN
INTRAMUSCULAR | Status: AC
Start: 2023-11-18 — End: 2023-11-18
  Filled 2023-11-18: qty 5

## 2023-11-18 MED ORDER — PANTOPRAZOLE SODIUM 40 MG PO TBEC
40.0000 mg | DELAYED_RELEASE_TABLET | Freq: Every day | ORAL | Status: DC
  Administered 2023-11-19: 40 mg via ORAL
  Filled 2023-11-18 (×2): qty 1

## 2023-11-18 MED ORDER — ACETAMINOPHEN 500 MG PO TABS: 1000.0000 mg | ORAL_TABLET | Freq: Once | ORAL | Status: DC

## 2023-11-18 MED ORDER — OXYCODONE HCL 5 MG PO TABS: 5.0000 mg | ORAL_TABLET | ORAL | Status: DC | PRN

## 2023-11-18 MED ORDER — DEXAMETHASONE SODIUM PHOSPHATE 10 MG/ML IJ SOLN
INTRAMUSCULAR | Status: AC
  Filled 2023-11-18: qty 1

## 2023-11-18 MED ORDER — BUPIVACAINE LIPOSOME 1.3 % IJ SUSP
INTRAMUSCULAR | Status: DC | PRN
  Administered 2023-11-18: 20 mL

## 2023-11-18 MED ORDER — BISACODYL 5 MG PO TBEC: 5.0000 mg | DELAYED_RELEASE_TABLET | Freq: Every day | ORAL | Status: DC | PRN

## 2023-11-18 MED ORDER — METHOCARBAMOL 500 MG PO TABS
500.0000 mg | ORAL_TABLET | Freq: Four times a day (QID) | ORAL | Status: DC | PRN
Start: 2023-11-18 — End: 2023-11-19
  Administered 2023-11-19: 500 mg via ORAL
  Filled 2023-11-18: qty 1

## 2023-11-18 MED ORDER — LACTATED RINGERS IV BOLUS: 500.0000 mL | Freq: Once | INTRAVENOUS | Status: DC

## 2023-11-18 SURGICAL SUPPLY — 45 items
ARTISURF 11M PLY L 6-9CD KNEE (Knees) IMPLANT
BAG COUNTER SPONGE SURGICOUNT (BAG) IMPLANT
BAG ZIPLOCK 12X15 (MISCELLANEOUS) ×1 IMPLANT
BLADE SAGITTAL 13X1.27X60 (BLADE) ×1 IMPLANT
BLADE SAW SGTL 18X1.27X75 (BLADE) ×1 IMPLANT
BLADE SURG 15 STRL LF DISP TIS (BLADE) ×1 IMPLANT
BNDG ELASTIC 6INX 5YD STR LF (GAUZE/BANDAGES/DRESSINGS) ×1 IMPLANT
BOWL SMART MIX CTS (DISPOSABLE) ×1 IMPLANT
CEMENT BONE R 1X40 (Cement) ×2 IMPLANT
COVER SURGICAL LIGHT HANDLE (MISCELLANEOUS) ×1 IMPLANT
CUFF TRNQT CYL 34X4.125X (TOURNIQUET CUFF) ×1 IMPLANT
DRAPE INCISE IOBAN 66X45 STRL (DRAPES) ×2 IMPLANT
DRAPE U-SHAPE 47X51 STRL (DRAPES) ×1 IMPLANT
DRSG AQUACEL AG ADV 3.5X10 (GAUZE/BANDAGES/DRESSINGS) ×1 IMPLANT
DURAPREP 26ML APPLICATOR (WOUND CARE) ×2 IMPLANT
ELECT PENCIL ROCKER SW 15FT (MISCELLANEOUS) ×1 IMPLANT
ELECT REM PT RETURN 15FT ADLT (MISCELLANEOUS) ×1 IMPLANT
FEMORAL KNEE COMPONENT SZ6 LT (Joint) IMPLANT
GLOVE BIOGEL M 7.0 STRL (GLOVE) IMPLANT
GLOVE BIOGEL PI IND STRL 7.5 (GLOVE) IMPLANT
GLOVE BIOGEL PI IND STRL 8.5 (GLOVE) ×1 IMPLANT
GLOVE SURG ORTHO 8.0 STRL STRW (GLOVE) ×2 IMPLANT
GOWN STRL REUS W/ TWL XL LVL3 (GOWN DISPOSABLE) ×2 IMPLANT
HOLDER FOLEY CATH W/STRAP (MISCELLANEOUS) ×1 IMPLANT
HOOD PEEL AWAY T7 (MISCELLANEOUS) ×3 IMPLANT
KIT TURNOVER KIT A (KITS) ×1 IMPLANT
MANIFOLD NEPTUNE II (INSTRUMENTS) ×1 IMPLANT
NS IRRIG 1000ML POUR BTL (IV SOLUTION) ×1 IMPLANT
PACK TOTAL KNEE CUSTOM (KITS) ×1 IMPLANT
PROTECTOR NERVE ULNAR (MISCELLANEOUS) ×1 IMPLANT
SET HNDPC FAN SPRY TIP SCT (DISPOSABLE) ×1 IMPLANT
SPIKE FLUID TRANSFER (MISCELLANEOUS) ×2 IMPLANT
STEM POLY PAT PLY 35M KNEE (Knees) IMPLANT
STEM TIBIA 5 DEG SZ D L KNEE (Knees) IMPLANT
STRIP CLOSURE SKIN 1/2X4 (GAUZE/BANDAGES/DRESSINGS) ×1 IMPLANT
SUT BONE WAX W31G (SUTURE) ×1 IMPLANT
SUT MNCRL AB 3-0 PS2 18 (SUTURE) ×1 IMPLANT
SUT STRATAFIX 1PDS 45CM VIOLET (SUTURE) ×1 IMPLANT
SUT VIC AB 0 CT1 36 (SUTURE) ×1 IMPLANT
SUT VIC AB 1 CT1 36 (SUTURE) ×1 IMPLANT
SUTURE STRATFX 0 PDS 27 VIOLET (SUTURE) ×1 IMPLANT
TRAY FOLEY MTR SLVR 16FR STAT (SET/KITS/TRAYS/PACK) ×1 IMPLANT
TUBE SUCTION HIGH CAP CLEAR NV (SUCTIONS) ×1 IMPLANT
WATER STERILE IRR 1000ML POUR (IV SOLUTION) ×2 IMPLANT
WRAP KNEE MAXI GEL POST OP (GAUZE/BANDAGES/DRESSINGS) ×1 IMPLANT

## 2023-11-18 NOTE — Op Note (Signed)
 TOTAL KNEE REPLACEMENT OPERATIVE NOTE:  11/18/2023  1:24 PM  PATIENT:  Kristy Cox  78 y.o. female  PRE-OPERATIVE DIAGNOSIS:  Primary osteoarthritis of left knee M17.12  POST-OPERATIVE DIAGNOSIS:  * No post-op diagnosis entered *  PROCEDURE:  Procedure(s): ARTHROPLASTY, KNEE, TOTAL  SURGEON:  Surgeon(s): Rubie Kemps, MD  PHYSICIAN ASSISTANT: Almeda Rummer, PA-C  ANESTHESIA:   spinal  SPECIMEN: None  COUNTS:  Correct  TOURNIQUET:   Total Tourniquet Time Documented: Thigh (Left) - 35 minutes Total: Thigh (Left) - 35 minutes   DICTATION:  Indication for procedure:    The patient is a 78 y.o. female who has failed conservative treatment for Primary osteoarthritis of left knee M17.12.  Informed consent was obtained prior to anesthesia. The risks versus benefits of the operation were explain and in a way the patient can, and did, understand.    Description of procedure:     The patient was taken to the operating room and placed under anesthesia.  The patient was positioned in the usual fashion taking care that all body parts were adequately padded and/or protected.  A tourniquet was applied and the leg prepped and draped in the usual sterile fashion.  The extremity was exsanguinated with the esmarch and tourniquet inflated to 250 mmHg.  Pre-operative range of motion was normal.  The knee was in 5 degree of mild varus.  A midline incision approximately 6-7 inches long was made with a #10 blade.  A new blade was used to make a parapatellar arthrotomy going 2-3 cm into the quadriceps tendon, over the patella, and alongside the medial aspect of the patellar tendon.  A synovectomy was then performed with the #10 blade and forceps. I then elevated the deep MCL off the medial tibial metaphysis subperiosteally around to the semimembranosus attachment.    I everted the patella and used calipers to measure patellar thickness.  I used the reamer to ream down to appropriate thickness to  recreate the native thickness.  I then removed excess bone with the rongeur and sagittal saw.  I used the appropriately sized template and drilled the three lug holes.  I then put the trial in place and measured the thickness with the calipers to ensure recreation of the native thickness.  The trial was then removed and the patella subluxed and the knee brought into flexion.  A homan retractor was place to retract and protect the patella and lateral structures.  A Z-retractor was place medially to protect the medial structures.  The extra-medullary alignment system was used to make cut the tibial articular surface perpendicular to the anamotic axis of the tibia and in 3 degrees of posterior slope.  The cut surface and alignment jig was removed.  I then used the intramedullary alignment guide to make a 5 valgus cut on the distal femur.  I then marked out the epicondylar axis on the distal femur.  The posterior condylar axis measured 3 degrees.  I then used the anterior referencing sizer and measured the femur to be a size 6.  The 4-In-1 cutting block was screwed into place in external rotation matching the posterior condylar angle, making our cuts perpendicular to the epicondylar axis.  Anterior, posterior and chamfer cuts were made with the sagittal saw.  The cutting block and cut pieces were removed.  A lamina spreader was placed in 90 degrees of flexion.  The ACL, PCL, menisci, and posterior condylar osteophytes were removed.  A 11 mm spacer blocked was found to offer good  flexion and extension gap balance after minimal in degree releasing.   The scoop retractor was then placed and the femoral finishing block was pinned in place.  The small sagittal saw was used as well as the lug drill to finish the femur.  The block and cut surfaces were removed and the medullary canal hole filled with autograft bone from the cut pieces.  The tibia was delivered forward in deep flexion and external rotation.  A size D  tray was selected and pinned into place centered on the medial 1/3 of the tibial tubercle.  The reamer and keel was used to prepare the tibia through the tray.    I then trialed with the size 6 femur, size D tibia, a 11 mm insert and the 32 patella.  I had excellent flexion/extension gap balance, excellent patella tracking.  Flexion was full and beyond 120 degrees; extension was zero.  These components were chosen and the staff opened them to me on the back table while the knee was lavaged copiously and the cement mixed.  The soft tissue was infiltrated with 60cc of exparel  1.3% through a 21 gauge needle.  I cemented in the components and removed all excess cement.  The polyethylene tibial component was snapped into place and the knee placed in extension while cement was hardening.  The capsule was infilltrated with a 60cc exparel /marcaine /saline mixture.   Once the cement was hard, the tourniquet was let down.  Hemostasis was obtained.  The arthrotomy was closed using a #1 stratofix running suture.  The deep soft tissues were closed with #0 vicryls and the subcuticular layer closed with #2-0 vicryl.  The skin was reapproximated and closed with 3.0 Monocryl.  The wound was covered with steristrips, aquacel dressing, and a TED stocking.   The patient was then awakened, extubated, and taken to the recovery room in stable condition.  BLOOD LOSS:  300cc COMPLICATIONS:  None.  PLAN OF CARE: Discharge to home after PACU  PATIENT DISPOSITION:  PACU - hemodynamically stable.   Delay start of Pharmacological VTE agent (>24hrs) due to surgical blood loss or risk of bleeding:  yes  Please fax a copy of this op note to my office at [phone] (please only include page 1 and 2 of the Case Information op note)

## 2023-11-18 NOTE — H&P (Signed)
 Kristy Cox MRN:  [id number] DOB/SEX:  1945/09/01/female  CHIEF COMPLAINT:  Painful left Knee  HISTORY: Patient is a 78 y.o. female presented with a history of pain in the left knee. Onset of symptoms was gradual starting a few years ago with gradually worsening course since that time. Patient has been treated conservatively with over-the-counter NSAIDs and activity modification. Patient currently rates pain in the knee at 10 out of 10 with activity. There is pain at night.  PAST MEDICAL HISTORY: Patient Active Problem List   Diagnosis Date Noted   Normocytic anemia 02/24/2014   Back pain 02/24/2014   Abdominal pain in female 01/01/2014   Diverticula of colon 01/01/2014   Nausea with vomiting 10/19/2013   Epigastric pain 10/31/2012   Metatarsalgia of left foot 12/26/2011   Foot pain 12/25/2011   Foot pain, left 12/19/2011   Dizziness 09/09/2011   HTN (hypertension) 04/18/2011   HLD (hyperlipidemia) 04/18/2011   Osteoporosis 04/18/2011   PVC's (premature ventricular contractions) 04/18/2011   Acid reflux 04/18/2011   Past Medical History:  Diagnosis Date   Acid reflux    Anemia    Arthritis    Dysrhythmia    Hypercholesteremia    Hypertension    Irregular heart beat    PVC's   Osteoporosis    Pneumonia    Past Surgical History:  Procedure Laterality Date   ARTHROPLASTY Right    CYSTECTOMY     back   EYE SURGERY       MEDICATIONS:   No medications prior to admission.    ALLERGIES:   Allergies  Allergen Reactions   Azithromycin  Nausea And Vomiting   Codeine Nausea And Vomiting   Penicillins Other (See Comments)    didn't feel like myself Has patient had a PCN reaction causing immediate rash, facial/tongue/throat swelling, SOB or lightheadedness with hypotension: Yes Has patient had a PCN reaction causing severe rash involving mucus membranes or skin necrosis: No Has patient had a PCN reaction that required hospitalization No Has patient had a PCN  reaction occurring within the last 10 years: No If all of the above answers are NO, then may proceed with Cephalosporin use.    Prednisone Nausea And Vomiting    Raises pressure in her eyes    REVIEW OF SYSTEMS:  A comprehensive review of systems was negative except for: Musculoskeletal: positive for arthralgias and bone pain   FAMILY HISTORY:   Family History  Problem Relation Age of Onset   Breast cancer Mother    Lung cancer Sister    Coronary artery disease Other     SOCIAL HISTORY:   Social History   Tobacco Use   Smoking status: Never   Smokeless tobacco: Never  Substance Use Topics   Alcohol use: No     EXAMINATION:  Vital signs in last 24 hours:    There were no vitals taken for this visit.  General Appearance:    Alert, cooperative, no distress, appears stated age  Head:    Normocephalic, without obvious abnormality, atraumatic  Eyes:    PERRL, conjunctiva/corneas clear, EOM's intact, fundi    benign, both eyes  Ears:    Normal TM's and external ear canals, both ears  Nose:   Nares normal, septum midline, mucosa normal, no drainage    or sinus tenderness  Throat:   Lips, mucosa, and tongue normal; teeth and gums normal  Neck:   Supple, symmetrical, trachea midline, no adenopathy;    thyroid:  no enlargement/tenderness/nodules; no carotid  bruit or JVD  Back:     Symmetric, no curvature, ROM normal, no CVA tenderness  Lungs:     Clear to auscultation bilaterally, respirations unlabored  Chest Wall:    No tenderness or deformity   Heart:    Regular rate and rhythm, S1 and S2 normal, no murmur, rub   or gallop  Breast Exam:    No tenderness, masses, or nipple abnormality  Abdomen:     Soft, non-tender, bowel sounds active all four quadrants,    no masses, no organomegaly  Genitalia:    Normal female without lesion, discharge or tenderness  Rectal:    Normal tone, no masses or tenderness;   guaiac negative stool  Extremities:   Extremities normal,  atraumatic, no cyanosis or edema  Pulses:   2+ and symmetric all extremities  Skin:   Skin color, texture, turgor normal, no rashes or lesions  Lymph nodes:   Cervical, supraclavicular, and axillary nodes normal  Neurologic:   CNII-XII intact, normal strength, sensation and reflexes    throughout    Musculoskeletal:  ROM 0-120, Ligaments intact,  Imaging Review Plain radiographs demonstrate severe degenerative joint disease of the left knee. The overall alignment is neutral. The bone quality appears to be good for age and reported activity level.  Assessment/Plan: Primary osteoarthritis, left knee   The patient history, physical examination and imaging studies are consistent with advanced degenerative joint disease of the left knee. The patient has failed conservative treatment.  The clearance notes were reviewed.  After discussion with the patient it was felt that Total Knee Replacement was indicated. The procedure,  risks, and benefits of total knee arthroplasty were presented and reviewed. The risks including but not limited to aseptic loosening, infection, blood clots, vascular injury, stiffness, patella tracking problems complications among others were discussed. The patient acknowledged the explanation, agreed to proceed with the plan.  Preoperative templating of the joint replacement has been completed, documented, and submitted to the Operating Room personnel in order to optimize intra-operative equipment management.    Patient's anticipated LOS is less than 2 midnights, meeting these requirements: - Lives within 1 hour of care - Has a competent adult at home to recover with post-op recover - NO history of  - Chronic pain requiring opiods  - Diabetes  - Coronary Artery Disease  - Heart failure  - Heart attack  - Stroke  - DVT/VTE  - Cardiac arrhythmia  - Respiratory Failure/COPD  - Renal failure  - Anemia  - Advanced Liver disease     Kristy Cox 11/18/2023, 6:59  AM

## 2023-11-18 NOTE — Transfer of Care (Signed)
 Immediate Anesthesia Transfer of Care Note  Patient: Kristy Cox  Procedure(s) Performed: ARTHROPLASTY, KNEE, TOTAL (Left: Knee)  Patient Location: PACU  Anesthesia Type:General  Level of Consciousness: drowsy  Airway & Oxygen Therapy: Patient Spontanous Breathing and Patient connected to face mask oxygen  Post-op Assessment: Report given to RN and Post -op Vital signs reviewed and stable  Post vital signs: Reviewed and stable  Last Vitals:  Vitals Value Taken Time  BP    Temp    Pulse 52 11/18/23 12:33  Resp 12 11/18/23 12:33  SpO2 100 % 11/18/23 12:33  Vitals shown include unfiled device data.  Last Pain:  Vitals:   11/18/23 0747  TempSrc: Oral         Complications: No notable events documented.

## 2023-11-18 NOTE — Anesthesia Procedure Notes (Signed)
 Procedure Name: LMA Insertion Date/Time: 11/18/2023 10:42 AM  Performed by: Therisa Doyal CROME, CRNAPatient Re-evaluated:Patient Re-evaluated prior to induction Oxygen Delivery Method: Circle system utilized Preoxygenation: Pre-oxygenation with 100% oxygen Induction Type: IV induction LMA: LMA inserted LMA Size: 4.0 Number of attempts: 1 Placement Confirmation: positive ETCO2 and breath sounds checked- equal and bilateral Tube secured with: Tape Dental Injury: Teeth and Oropharynx as per pre-operative assessment

## 2023-11-18 NOTE — Evaluation (Signed)
 Physical Therapy Evaluation Patient Details Name: Kristy Cox MRN: [id number] DOB: 1945-07-01 Today's Date: 11/18/2023  History of Present Illness  Pt is 78 yo female s/p L TKA on 11/18/23.  Pt with hx including but not limited to anemia, back pain, dizziness, HTN, HLD, OP, arthritis, R TKA  Clinical Impression  Pt is s/p TKA resulting in the deficits listed below (see PT Problem List). At baseline, pt is independent.  She has home support and DME , but does have a flight of steps to enter the apartment.  Today, pt limited by nausea,vomiting, and lethargy.  She did dangle at EOB and demonstrated good quad activation and knee ROM but unable to progress further.  Expected to progress well once N/V improve. Pt will benefit from acute skilled PT to increase their independence and safety with mobility to allow discharge.          If plan is discharge home, recommend the following: A lot of help with walking and/or transfers;A lot of help with bathing/dressing/bathroom   Can travel by private vehicle        Equipment Recommendations None recommended by PT  Recommendations for Other Services       Functional Status Assessment Patient has had a recent decline in their functional status and demonstrates the ability to make significant improvements in function in a reasonable and predictable amount of time.     Precautions / Restrictions Precautions Precautions: Fall;Knee Restrictions Weight Bearing Restrictions Per Provider Order: Yes LLE Weight Bearing Per Provider Order: Weight bearing as tolerated      Mobility  Bed Mobility Overal bed mobility: Needs Assistance Bed Mobility: Supine to Sit, Sit to Supine     Supine to sit: Min assist Sit to supine: Min assist        Transfers                   General transfer comment: unable to stand due to lethargy and N/V; did scoot toward [facility] with CGA    Ambulation/Gait                  Stairs             Wheelchair Mobility     Tilt Bed    Modified Rankin (Stroke Patients Only)       Balance Overall balance assessment: Needs assistance Sitting-balance support: No upper extremity supported Sitting balance-Leahy Scale: Good                                       Pertinent Vitals/Pain Pain Assessment Pain Assessment: Faces Faces Pain Scale: Hurts a little bit Pain Location: L knee Pain Descriptors / Indicators: Discomfort Pain Intervention(s): Limited activity within patient's tolerance, Monitored during session, Premedicated before session, Repositioned    Home Living Family/patient expects to be discharged to:: Private residence Living Arrangements: Children Available Help at Discharge: Family;Available 24 hours/day Type of Home: Apartment Home Access: Stairs to enter Entrance Stairs-Rails: Right;Left;Can reach both Entrance Stairs-Number of Steps: 12   Home Layout: One level Home Equipment: Archivist (2 wheels);Cane - single point      Prior Function Prior Level of Function : Independent/Modified Independent             Mobility Comments: could ambulate in community ADLs Comments: pt independent with adls and ialds; does not drive     Extremity/Trunk Assessment  Upper Extremity Assessment Upper Extremity Assessment: Overall WFL for tasks assessed    Lower Extremity Assessment Lower Extremity Assessment: LLE deficits/detail;RLE deficits/detail RLE Deficits / Details: ROM WFL; MMT 5/5 LLE Deficits / Details: Expected post op changes; ROM knee 5 to 80 degrees; good quad activation; MMT at least 3/5 throghout    Cervical / Trunk Assessment Cervical / Trunk Assessment: Normal  Communication        Cognition Arousal: Lethargic, Suspect due to medications Behavior During Therapy: WFL for tasks assessed/performed   PT - Cognitive impairments: No apparent impairments                       PT -  Cognition Comments: Pt very lethargic - had general anesthesia then increased pain and nausea; pt given fentynal in PACU and dilaudid  on floor.  Suspect medication related lethargy.  Despite lethargy and n/v pt motivated to try what she could with therapy and needed therapy education that ok to return to bed today since so lethargic and vomiting twice when sitting         Cueing       General Comments General comments (skin integrity, edema, etc.): On 2 L O2 at arrival with sats 99%.  RA during session sats 97% but did replace 2 L at end of session as pt so lethargyc    Exercises     Assessment/Plan    PT Assessment Patient needs continued PT services  PT Problem List Decreased strength;Decreased mobility;Decreased range of motion;Decreased activity tolerance;Decreased balance;Decreased knowledge of use of DME       PT Treatment Interventions DME instruction;Therapeutic exercise;Gait training;Stair training;Functional mobility training;Therapeutic activities;Patient/family education;Modalities;Balance training    PT Goals (Current goals can be found in the Care Plan section)  Acute Rehab PT Goals Patient Stated Goal: return home PT Goal Formulation: With patient/family Time For Goal Achievement: 12/02/23 Potential to Achieve Goals: Good    Frequency 7X/week     Co-evaluation               AM-PAC PT 6 Clicks Mobility  Outcome Measure Help needed turning from your back to your side while in a flat bed without using bedrails?: A Little Help needed moving from lying on your back to sitting on the side of a flat bed without using bedrails?: A Little Help needed moving to and from a bed to a chair (including a wheelchair)?: Total Help needed standing up from a chair using your arms (e.g., wheelchair or bedside chair)?: Total Help needed to walk in hospital room?: Total Help needed climbing 3-5 steps with a railing? : Total 6 Click Score: 10    End of Session    Activity Tolerance: Patient limited by lethargy (and N/V) Patient left: in bed;with call bell/phone within reach;with SCD's reapplied;with family/visitor present Nurse Communication: Mobility status;Other (comment) (RN present - aware of N/V) PT Visit Diagnosis: Other abnormalities of gait and mobility (R26.89);Muscle weakness (generalized) (M62.81)    Time: 8358-8297 PT Time Calculation (min) (ACUTE ONLY): 21 min   Charges:   PT Evaluation $PT Eval Low Complexity: 1 Low   PT General Charges $$ ACUTE PT VISIT: 1 Visit         Benjiman, PT Acute Rehab [facility] [phone]   Benjiman Kristy Cox 11/18/2023, 5:23 PM

## 2023-11-18 NOTE — H&P (Signed)
 Kristy Cox MRN:  [id number] DOB/SEX:  Jun 04, 1945/female  CHIEF COMPLAINT:  Painful left Knee  HISTORY: Patient is a 78 y.o. female presented with a history of pain in the left knee. Onset of symptoms was gradual starting a few years ago with gradually worsening course since that time. Patient has been treated conservatively with over-the-counter NSAIDs and activity modification. Patient currently rates pain in the knee at 10 out of 10 with activity. There is pain at night.  PAST MEDICAL HISTORY: Patient Active Problem List   Diagnosis Date Noted   Normocytic anemia 02/24/2014   Back pain 02/24/2014   Abdominal pain in female 01/01/2014   Diverticula of colon 01/01/2014   Nausea with vomiting 10/19/2013   Epigastric pain 10/31/2012   Metatarsalgia of left foot 12/26/2011   Foot pain 12/25/2011   Foot pain, left 12/19/2011   Dizziness 09/09/2011   HTN (hypertension) 04/18/2011   HLD (hyperlipidemia) 04/18/2011   Osteoporosis 04/18/2011   PVC's (premature ventricular contractions) 04/18/2011   Acid reflux 04/18/2011   Past Medical History:  Diagnosis Date   Acid reflux    Anemia    Arthritis    Dysrhythmia    Hypercholesteremia    Hypertension    Irregular heart beat    PVC's   Osteoporosis    Pneumonia    Past Surgical History:  Procedure Laterality Date   ARTHROPLASTY Right    CYSTECTOMY     back   EYE SURGERY       MEDICATIONS:   Medications Prior to Admission  Medication Sig Dispense Refill Last Dose/Taking   acetaminophen  (TYLENOL ) 500 MG tablet Take 1,000 mg by mouth every 6 (six) hours as needed for mild pain or moderate pain.    Past Month   amLODipine  (NORVASC ) 2.5 MG tablet Take 2.5 mg by mouth daily.   11/18/2023 at  6:30 AM   atorvastatin  (LIPITOR) 40 MG tablet Take 40 mg by mouth every Friday.   11/15/2023   COLLAGEN PO Take 1 Scoop by mouth daily.   11/17/2023   ferrous sulfate  325 (65 FE) MG EC tablet Take 325 mg by mouth daily.   11/13/2023    latanoprost  (XALATAN ) 0.005 % ophthalmic solution Place 1 drop into both eyes at bedtime. 2.5 mL 11 11/18/2023 at  6:00 AM   lisinopril -hydrochlorothiazide  (ZESTORETIC ) 20-12.5 MG tablet Take 1 tablet by mouth daily.   11/17/2023   Multiple Vitamins-Minerals (WOMENS MULTIVITAMIN) TABS Take 1 tablet by mouth daily.   11/11/2023   timolol  (TIMOPTIC ) 0.5 % ophthalmic solution Place 1 drop into both eyes 2 (two) times daily.   11/17/2023   lisinopril  (ZESTRIL ) 10 MG tablet Take 1 tablet (10 mg total) by mouth 2 (two) times daily. (Patient not taking: Reported on 11/05/2023) 60 tablet 0 Not Taking   meclizine  (ANTIVERT ) 25 MG tablet Take 1 tablet (25 mg total) by mouth 2 (two) times daily as needed for dizziness. (Patient not taking: Reported on 11/05/2023) 6 tablet 0 Not Taking   pantoprazole  (PROTONIX ) 40 MG tablet Take 1 tablet (40 mg total) by mouth 2 (two) times daily. (Patient not taking: Reported on 11/05/2023) 90 tablet 3 Not Taking   simvastatin  (ZOCOR ) 40 MG tablet Take 1 tablet (40 mg total) by mouth at bedtime. (Patient not taking: Reported on 11/05/2023) 90 tablet 3 Not Taking    ALLERGIES:   Allergies  Allergen Reactions   Azithromycin  Nausea And Vomiting   Codeine Nausea And Vomiting   Penicillins Other (See Comments)  didn't feel like myself Has patient had a PCN reaction causing immediate rash, facial/tongue/throat swelling, SOB or lightheadedness with hypotension: Yes Has patient had a PCN reaction causing severe rash involving mucus membranes or skin necrosis: No Has patient had a PCN reaction that required hospitalization No Has patient had a PCN reaction occurring within the last 10 years: No If all of the above answers are NO, then may proceed with Cephalosporin use.    Prednisone Nausea And Vomiting    Raises pressure in her eyes    REVIEW OF SYSTEMS:  A comprehensive review of systems was negative except for: Musculoskeletal: positive for arthralgias and bone pain   FAMILY  HISTORY:   Family History  Problem Relation Age of Onset   Breast cancer Mother    Lung cancer Sister    Coronary artery disease Other     SOCIAL HISTORY:   Social History   Tobacco Use   Smoking status: Never   Smokeless tobacco: Never  Substance Use Topics   Alcohol use: No     EXAMINATION:  Vital signs in last 24 hours: Temp:  [98.1 F (36.7 C)] 98.1 F (36.7 C) (08/04 0747) Pulse Rate:  [55] 55 (08/04 0747) Resp:  [16] 16 (08/04 0747) BP: (157)/(81) 157/81 (08/04 0747) SpO2:  [100 %] 100 % (08/04 0747) Weight:  [66.7 kg] 66.7 kg (08/04 0755)  BP (!) 157/81   Pulse (!) 55   Temp 98.1 F (36.7 C) (Oral)   Resp 16   Ht 5' 4 (1.626 m)   Wt 66.7 kg   SpO2 100%   BMI 25.23 kg/m   General Appearance:    Alert, cooperative, no distress, appears stated age  Head:    Normocephalic, without obvious abnormality, atraumatic  Eyes:    PERRL, conjunctiva/corneas clear, EOM's intact, fundi    benign, both eyes  Ears:    Normal TM's and external ear canals, both ears  Nose:   Nares normal, septum midline, mucosa normal, no drainage    or sinus tenderness  Throat:   Lips, mucosa, and tongue normal; teeth and gums normal  Neck:   Supple, symmetrical, trachea midline, no adenopathy;    thyroid:  no enlargement/tenderness/nodules; no carotid   bruit or JVD  Back:     Symmetric, no curvature, ROM normal, no CVA tenderness  Lungs:     Clear to auscultation bilaterally, respirations unlabored  Chest Wall:    No tenderness or deformity   Heart:    Regular rate and rhythm, S1 and S2 normal, no murmur, rub   or gallop  Breast Exam:    No tenderness, masses, or nipple abnormality  Abdomen:     Soft, non-tender, bowel sounds active all four quadrants,    no masses, no organomegaly  Genitalia:    Normal female without lesion, discharge or tenderness  Rectal:    Normal tone, no masses or tenderness;   guaiac negative stool  Extremities:   Extremities normal, atraumatic, no cyanosis  or edema  Pulses:   2+ and symmetric all extremities  Skin:   Skin color, texture, turgor normal, no rashes or lesions  Lymph nodes:   Cervical, supraclavicular, and axillary nodes normal  Neurologic:   CNII-XII intact, normal strength, sensation and reflexes    throughout    Musculoskeletal:  ROM 0-120, Ligaments intact,  Imaging Review Plain radiographs demonstrate severe degenerative joint disease of the left knee. The overall alignment is neutral. The bone quality appears to be good  for age and reported activity level.  Assessment/Plan: Primary osteoarthritis, left knee   The patient history, physical examination and imaging studies are consistent with advanced degenerative joint disease of the left knee. The patient has failed conservative treatment.  The clearance notes were reviewed.  After discussion with the patient it was felt that Total Knee Replacement was indicated. The procedure,  risks, and benefits of total knee arthroplasty were presented and reviewed. The risks including but not limited to aseptic loosening, infection, blood clots, vascular injury, stiffness, patella tracking problems complications among others were discussed. The patient acknowledged the explanation, agreed to proceed with the plan.  Preoperative templating of the joint replacement has been completed, documented, and submitted to the Operating Room personnel in order to optimize intra-operative equipment management.    Patient's anticipated LOS is less than 2 midnights, meeting these requirements: - Lives within 1 hour of care - Has a competent adult at home to recover with post-op recover - NO history of  - Chronic pain requiring opiods  - Diabetes  - Coronary Artery Disease  - Heart failure  - Heart attack  - Stroke  - DVT/VTE  - Cardiac arrhythmia  - Respiratory Failure/COPD  - Renal failure  - Anemia  - Advanced Liver disease     Jehan Ranganathan,STEPHEN D 11/18/2023, 10:37 AM

## 2023-11-18 NOTE — Anesthesia Procedure Notes (Addendum)
 Anesthesia Regional Block: Adductor canal block   Pre-Anesthetic Checklist: , timeout performed,  Correct Patient, Correct Site, Correct Laterality,  Correct Procedure, Correct Position, site marked,  Risks and benefits discussed,  Surgical consent,  Pre-op evaluation,  At surgeon's request and post-op pain management  Laterality: Left  Prep: chloraprep       Needles:  Injection technique: Single-shot  Needle Type: Echogenic Stimulator Needle     Needle Length: 5cm  Needle Gauge: 22     Additional Needles:   Procedures:,,,, ultrasound used (permanent image in chart),,    Narrative:  Start time: 11/18/2023 8:49 AM End time: 11/18/2023 8:55 AM Injection made incrementally with aspirations every 5 mL.  Performed by: Personally  Anesthesiologist: Mallory Manus, MD  Additional Notes: Functioning IV was confirmed and monitors were applied.  A 50mm 22ga Arrow echogenic stimulator needle was used. Sterile prep and drape,hand hygiene and sterile gloves were used. Ultrasound guidance: relevant anatomy identified, needle position confirmed, local anesthetic spread visualized around nerve(s)., vascular puncture avoided.  Image printed for medical record. Negative aspiration and negative test dose prior to incremental administration of local anesthetic. The patient tolerated the procedure well.

## 2023-11-18 NOTE — Progress Notes (Signed)
 Pt had one episode of vomiting with 50 cc clear emesis. PRN Reglan  has been effective for n/v. PRN IV pain med effective for pain in left knee. Pt refused to take PO medications and stated will take PO BP meds at bedtime. Time adjusted as requested. Pt tolerated few bites of crackers and ginger ale. BP 131/79, pulse 65, resp 15, temp 98.1, O2 100 in 2 L.  Pt dangled at edge of the bed with PT. Pt currently resting with call bell in reach. Son at bedside.

## 2023-11-18 NOTE — Progress Notes (Signed)
 Orthopedic Tech Progress Note Patient Details:  Kristy Cox 1945/08/07 [id number]  Ortho Devices Type of Ortho Device: Bone foam zero knee Ortho Device/Splint Interventions: Ordered, Application, Adjustment   Post Interventions Patient Tolerated: Well  [name] 11/18/2023, 12:39 PM

## 2023-11-19 ENCOUNTER — Encounter (HOSPITAL_COMMUNITY): Payer: Self-pay | Admitting: Orthopedic Surgery

## 2023-11-19 DIAGNOSIS — M1712 Unilateral primary osteoarthritis, left knee: Secondary | ICD-10-CM | POA: Diagnosis not present

## 2023-11-19 NOTE — Discharge Summary (Signed)
 SPORTS MEDICINE & JOINT REPLACEMENT   Garnette Raman, MD   Laurell Simpers, PA-C [address] [place], [place], [place]  [address]                             [phone]  PATIENT ID: Kristy Cox        MRN:  [id number]          DOB/AGE: 08/14/1945 / 78 y.o.    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 admitted on 11/18/2023 and found to have a diagnosis of Primary osteoarthritis of left knee M17.12.  After appropriate laboratory studies were obtained  they were taken to the operating room on 11/18/2023 and underwent Procedure(s): ARTHROPLASTY, KNEE, TOTAL.   They were given perioperative antibiotics:  Anti-infectives (From admission, onward)    Start     Dose/Rate Route Frequency Ordered Stop   11/18/23 1645  ceFAZolin  (ANCEF ) IVPB 2g/100 mL premix        2 g 200 mL/hr over 30 Minutes Intravenous Every 6 hours 11/18/23 1213 11/18/23 2155   11/18/23 0745  ceFAZolin  (ANCEF ) IVPB 2g/100 mL premix        2 g 200 mL/hr over 30 Minutes Intravenous On call to O.R. 11/18/23 [id number] 11/18/23 1054     .  Patient given tranexamic acid  IV or topical and exparel  intra-operatively.  Tolerated the procedure well.    POD# 1: Vital signs were stable.  Patient denied Chest pain, shortness of breath, or calf pain.  Patient was started on Aspirin  twice  daily at 8am.  Consults to PT, OT, and care management were made.  The patient was weight bearing as tolerated.  CPM was placed on the operative leg 0-90 degrees for 6-8 hours a day. When out of the CPM, patient was placed in the foam block to achieve full extension. Incentive spirometry was taught.  Dressing was changed.       POD #2, Continued  PT for ambulation and exercise program.  IV saline locked.  O2 discontinued.    The remainder of the hospital course was dedicated to ambulation and strengthening.   The patient was discharged on 1 Day Post-Op in  Good condition.  Blood products given:none  DIAGNOSTIC STUDIES: Recent vital signs: Patient Vitals for the past 24 hrs:  BP Temp Temp src Pulse Resp SpO2  11/19/23 0903 (!) 101/51 98.9 F (37.2 C) Oral (!) 57 14 96 %  11/19/23 0643 132/70 98.3 F (36.8 C) Oral 68 17 97 %  11/19/23 0120 130/79 98.5 F (36.9 C) Oral 71 18 94 %  11/18/23 2155 (!) 150/83 97.9 F (36.6 C) -- 71 15 96 %  11/18/23 1742 -- -- --  62 -- --  11/18/23 1736 131/79 98.1 F (36.7 C) -- (!) 52 15 100 %  11/18/23 1653 (!) 171/82 -- -- 61 -- 98 %  11/18/23 1543 (!) 145/73 98 F (36.7 C) Oral 62 12 99 %  11/18/23 1530 129/64 97.8 F (36.6 C) -- (!) 51 12 98 %  11/18/23 1515 122/68 -- -- (!) 50 19 97 %  11/18/23 1500 121/66 -- -- (!) 48 12 97 %  11/18/23 1445 122/66 -- -- (!) 48 12 97 %  11/18/23 1430 123/72 -- -- 61 (!) 23 96 %  11/18/23 1415 129/63 97.6 F (36.4 C) -- (!) 48 14 97 %  11/18/23 1400 135/62 -- -- (!) 46 19 98 %  11/18/23 1345 137/83 (!) 97 F (36.1 C) -- (!) 53 (!) 21 98 %  11/18/23 1330 124/69 -- -- (!) 54 17 100 %  11/18/23 1315 120/62 -- -- (!) 53 10 99 %  11/18/23 1300 111/73 -- -- (!) 50 18 93 %  11/18/23 1245 110/70 -- -- (!) 49 12 100 %  11/18/23 1233 117/70 97.6 F (36.4 C) -- (!) 53 12 100 %       Recent laboratory studies: No results for input(s): WBC, HGB, HCT, PLT in the last 168 hours. No results for input(s): NA,  K, CL, CO2, BUN, CREATININE, GLUCOSE, CALCIUM  in the last 168 hours. No results found for: INR, PROTIME   Recent Radiographic Studies :  No results found.  DISCHARGE INSTRUCTIONS: Discharge Instructions     Call MD / Call 911   Complete by: As directed    If you experience chest pain or shortness of breath, CALL 911 and be transported to the hospital emergency room.  If you develope a fever above 101 F, pus (white drainage) or increased drainage or redness at the wound, or calf pain, call your surgeon's office.   Call MD / Call 911   Complete by: As directed    If you experience chest pain or shortness of breath, CALL 911 and be transported to the hospital emergency room.  If you develope a fever above 101 F, pus (white drainage) or increased drainage or redness at the wound, or calf pain, call your surgeon's office.   Constipation Prevention   Complete by: As directed    Drink plenty of fluids.  Prune juice may be helpful.  You may use a stool softener, such as Colace (over the counter) 100 mg twice a day.  Use MiraLax (over the counter) for constipation as needed.   Constipation Prevention   Complete by: As directed    Drink plenty of fluids.  Prune juice may be helpful.  You may use a stool softener, such as Colace (over the counter) 100 mg twice a day.  Use MiraLax (over the counter) for constipation as needed.   Diet - low sodium heart healthy   Complete by: As directed    Diet - low sodium heart healthy   Complete by: As directed    Discharge instructions   Complete by: As directed    INSTRUCTIONS AFTER JOINT REPLACEMENT   Remove items at home which could result in a fall. This includes throw rugs or furniture in walking pathways ICE to the affected joint every three hours while awake for 30 minutes at a time, for at least the first 3-5 days, and then as needed for pain and swelling.  Continue to use ice for pain and swelling. You may notice swelling  that will  progress down to the foot and ankle.  This is normal after surgery.  Elevate your leg when you are not up walking on it.   Continue to use the breathing machine you got in the hospital (incentive spirometer) which will help keep your temperature down.  It is common for your temperature to cycle up and down following surgery, especially at night when you are not up moving around and exerting yourself.  The breathing machine keeps your lungs expanded and your temperature down.   DIET:  As you were doing prior to hospitalization, we recommend a well-balanced diet.  DRESSING / WOUND CARE / SHOWERING  Keep the surgical dressing until follow up.  The dressing is water  proof, so you can shower without any extra covering.  IF THE DRESSING FALLS OFF or the wound gets wet inside, change the dressing with sterile gauze.  Please use good hand washing techniques before changing the dressing.  Do not use any lotions or creams on the incision until instructed by your surgeon.    ACTIVITY  Increase activity slowly as tolerated, but follow the weight bearing instructions below.   No driving for 6 weeks or until further direction given by your physician.  You cannot drive while taking narcotics.  No lifting or carrying greater than 10 lbs. until further directed by your surgeon. Avoid periods of inactivity such as sitting longer than an hour when not asleep. This helps prevent blood clots.  You may return to work once you are authorized by your doctor.     WEIGHT BEARING   Weight bearing as tolerated with assist device (walker, cane, etc) as directed, use it as long as suggested by your surgeon or therapist, typically at least 4-6 weeks.   EXERCISES  Results after joint replacement surgery are often greatly improved when you follow the exercise, range of motion and muscle strengthening exercises prescribed by your doctor. Safety measures are also important to protect the joint from further injury. Any time  any of these exercises cause you to have increased pain or swelling, decrease what you are doing until you are comfortable again and then slowly increase them. If you have problems or questions, call your caregiver or physical therapist for advice.   Rehabilitation is important following a joint replacement. After just a few days of immobilization, the muscles of the leg can become weakened and shrink (atrophy).  These exercises are designed to build up the tone and strength of the thigh and leg muscles and to improve motion. Often times heat used for twenty to thirty minutes before working out will loosen up your tissues and help with improving the range of motion but do not use heat for the first two weeks following surgery (sometimes heat can increase post-operative swelling).   These exercises can be done on a training (exercise) mat, on the floor, on a table or on a bed. Use whatever works the best and is most comfortable for you.    Use music or television while you are exercising so that the exercises are a pleasant break in your day. This will make your life better with the exercises acting as a break in your routine that you can look forward to.   Perform all exercises about fifteen times, three times per day or as directed.  You should exercise both the operative leg and the other leg as well.  Exercises include:   Quad Sets - Tighten up the muscle on the front of  the thigh (Quad) and hold for 5-10 seconds.   Straight Leg Raises - With your knee straight (if you were given a brace, keep it on), lift the leg to 60 degrees, hold for 3 seconds, and slowly lower the leg.  Perform this exercise against resistance later as your leg gets stronger.  Leg Slides: Lying on your back, slowly slide your foot toward your buttocks, bending your knee up off the floor (only go as far as is comfortable). Then slowly slide your foot back down until your leg is flat on the floor again.  Angel Wings: Lying on your  back spread your legs to the side as far apart as you can without causing discomfort.  Hamstring Strength:  Lying on your back, push your heel against the floor with your leg straight by tightening up the muscles of your buttocks.  Repeat, but this time bend your knee to a comfortable angle, and push your heel against the floor.  You may put a pillow under the heel to make it more comfortable if necessary.   A rehabilitation program following joint replacement surgery can speed recovery and prevent re-injury in the future due to weakened muscles. Contact your doctor or a physical therapist for more information on knee rehabilitation.    CONSTIPATION  Constipation is defined medically as fewer than three stools per week and severe constipation as less than one stool per week.  Even if you have a regular bowel pattern at home, your normal regimen is likely to be disrupted due to multiple reasons following surgery.  Combination of anesthesia, postoperative narcotics, change in appetite and fluid intake all can affect your bowels.   YOU MUST use at least one of the following options; they are listed in order of increasing strength to get the job done.  They are all available over the counter, and you may need to use some, POSSIBLY even all of these options:    Drink plenty of fluids (prune juice may be helpful) and high fiber foods Colace 100 mg by mouth twice a day  Senokot for constipation as directed and as needed Dulcolax (bisacodyl ), take with full glass of water   Miralax (polyethylene glycol) once or twice a day as needed.  If you have tried all these things and are unable to have a bowel movement in the first 3-4 days after surgery call either your surgeon or your primary doctor.    If you experience loose stools or diarrhea, hold the medications until you stool forms back up.  If your symptoms do not get better within 1 week or if they get worse, check with your doctor.  If you experience the  worst abdominal pain ever or develop nausea or vomiting, please contact the office immediately for further recommendations for treatment.   ITCHING:  If you experience itching with your medications, try taking only a single pain pill, or even half a pain pill at a time.  You can also use Benadryl  over the counter for itching or also to help with sleep.   TED HOSE STOCKINGS:  Use stockings on both legs until for at least 2 weeks or as directed by physician office. They may be removed at night for sleeping.  MEDICATIONS:  See your medication summary on the After Visit Summary that nursing will review with you.  You may have some home medications which will be placed on hold until you complete the course of blood thinner medication.  It is important for you to  complete the blood thinner medication as prescribed.  PRECAUTIONS:  If you experience chest pain or shortness of breath - call 911 immediately for transfer to the hospital emergency department.   If you develop a fever greater that 101 F, purulent drainage from wound, increased redness or drainage from wound, foul odor from the wound/dressing, or calf pain - CONTACT YOUR SURGEON.                                                   FOLLOW-UP APPOINTMENTS:  If you do not already have a post-op appointment, please call the office for an appointment to be seen by your surgeon.  Guidelines for how soon to be seen are listed in your After Visit Summary, but are typically between 1-4 weeks after surgery.  OTHER INSTRUCTIONS:   Knee Replacement:  Do not place pillow under knee, focus on keeping the knee straight while resting. CPM instructions: 0-90 degrees, 2 hours in the morning, 2 hours in the afternoon, and 2 hours in the evening. Place foam block, curve side up under heel at all times except when in CPM or when walking.  DO NOT modify, tear, cut, or change the foam block in any way.  POST-OPERATIVE OPIOID TAPER INSTRUCTIONS: It is important to  wean off of your opioid medication as soon as possible. If you do not need pain medication after your surgery it is ok to stop day one. Opioids include: Codeine, Hydrocodone (Norco, Vicodin), Oxycodone (Percocet, oxycontin ) and hydromorphone  amongst others.  Long term and even short term use of opiods can cause: Increased pain response Dependence Constipation Depression Respiratory depression And more.  Withdrawal symptoms can include Flu like symptoms Nausea, vomiting And more Techniques to manage these symptoms Hydrate well Eat regular healthy meals Stay active Use relaxation techniques(deep breathing, meditating, yoga) Do Not substitute Alcohol to help with tapering If you have been on opioids for less than two weeks and do not have pain than it is ok to stop all together.  Plan to wean off of opioids This plan should start within one week post op of your joint replacement. Maintain the same interval or time between taking each dose and first decrease the dose.  Cut the total daily intake of opioids by one tablet each day Next start to increase the time between doses. The last dose that should be eliminated is the evening dose.     MAKE SURE YOU:  Understand these instructions.  Get help right away if you are not doing well or get worse.    Thank you for letting us  be a part of your medical care team.  It is a privilege we respect greatly.  We hope these instructions will help you stay on track for a fast and full recovery!   Discharge instructions   Complete by: As directed    INSTRUCTIONS AFTER JOINT REPLACEMENT   Remove items at home which could result in a fall. This includes throw rugs or furniture in walking pathways ICE to the affected joint every three hours while awake for 30 minutes at a time, for at least the first 3-5 days, and then as needed for pain and swelling.  Continue to use ice for pain and swelling. You may notice swelling that will progress down to the  foot and ankle.  This is normal after surgery.  Elevate your leg when you are not up walking on it.   Continue to use the breathing machine you got in the hospital (incentive spirometer) which will help keep your temperature down.  It is common for your temperature to cycle up and down following surgery, especially at night when you are not up moving around and exerting yourself.  The breathing machine keeps your lungs expanded and your temperature down.   DIET:  As you were doing prior to hospitalization, we recommend a well-balanced diet.  DRESSING / WOUND CARE / SHOWERING  Keep the surgical dressing until follow up.  The dressing is water  proof, so you can shower without any extra covering.  IF THE DRESSING FALLS OFF or the wound gets wet inside, change the dressing with sterile gauze.  Please use good hand washing techniques before changing the dressing.  Do not use any lotions or creams on the incision until instructed by your surgeon.    ACTIVITY  Increase activity slowly as tolerated, but follow the weight bearing instructions below.   No driving for 6 weeks or until further direction given by your physician.  You cannot drive while taking narcotics.  No lifting or carrying greater than 10 lbs. until further directed by your surgeon. Avoid periods of inactivity such as sitting longer than an hour when not asleep. This helps prevent blood clots.  You may return to work once you are authorized by your doctor.     WEIGHT BEARING   Weight bearing as tolerated with assist device (walker, cane, etc) as directed, use it as long as suggested by your surgeon or therapist, typically at least 4-6 weeks.   EXERCISES  Results after joint replacement surgery are often greatly improved when you follow the exercise, range of motion and muscle strengthening exercises prescribed by your doctor. Safety measures are also important to protect the joint from further injury. Any time any of these  exercises cause you to have increased pain or swelling, decrease what you are doing until you are comfortable again and then slowly increase them. If you have problems or questions, call your caregiver or physical therapist for advice.   Rehabilitation is important following a joint replacement. After just a few days of immobilization, the muscles of the leg can become weakened and shrink (atrophy).  These exercises are designed to build up the tone and strength of the thigh and leg muscles and to improve motion. Often times heat used for twenty to thirty minutes before working out will loosen up your tissues and help with improving the range of motion but do not use heat for the first two weeks following surgery (sometimes heat can increase post-operative swelling).   These exercises can be done on a training (exercise) mat, on the floor, on a table or on a bed. Use whatever works the best and is most comfortable for you.    Use music or television while you are exercising so that the exercises are a pleasant break in your day. This will make your life better with the exercises acting as a break in your routine that you can look forward to.   Perform all exercises about fifteen times, three times per day or as directed.  You should exercise both the operative leg and the other leg as well.  Exercises include:   Quad Sets - Tighten up the muscle on the front of the thigh (Quad) and hold for 5-10 seconds.   Straight Leg Raises - With your knee  straight (if you were given a brace, keep it on), lift the leg to 60 degrees, hold for 3 seconds, and slowly lower the leg.  Perform this exercise against resistance later as your leg gets stronger.  Leg Slides: Lying on your back, slowly slide your foot toward your buttocks, bending your knee up off the floor (only go as far as is comfortable). Then slowly slide your foot back down until your leg is flat on the floor again.  Angel Wings: Lying on your back spread  your legs to the side as far apart as you can without causing discomfort.  Hamstring Strength:  Lying on your back, push your heel against the floor with your leg straight by tightening up the muscles of your buttocks.  Repeat, but this time bend your knee to a comfortable angle, and push your heel against the floor.  You may put a pillow under the heel to make it more comfortable if necessary.   A rehabilitation program following joint replacement surgery can speed recovery and prevent re-injury in the future due to weakened muscles. Contact your doctor or a physical therapist for more information on knee rehabilitation.    CONSTIPATION  Constipation is defined medically as fewer than three stools per week and severe constipation as less than one stool per week.  Even if you have a regular bowel pattern at home, your normal regimen is likely to be disrupted due to multiple reasons following surgery.  Combination of anesthesia, postoperative narcotics, change in appetite and fluid intake all can affect your bowels.   YOU MUST use at least one of the following options; they are listed in order of increasing strength to get the job done.  They are all available over the counter, and you may need to use some, POSSIBLY even all of these options:    Drink plenty of fluids (prune juice may be helpful) and high fiber foods Colace 100 mg by mouth twice a day  Senokot for constipation as directed and as needed Dulcolax (bisacodyl ), take with full glass of water   Miralax (polyethylene glycol) once or twice a day as needed.  If you have tried all these things and are unable to have a bowel movement in the first 3-4 days after surgery call either your surgeon or your primary doctor.    If you experience loose stools or diarrhea, hold the medications until you stool forms back up.  If your symptoms do not get better within 1 week or if they get worse, check with your doctor.  If you experience the worst  abdominal pain ever or develop nausea or vomiting, please contact the office immediately for further recommendations for treatment.   ITCHING:  If you experience itching with your medications, try taking only a single pain pill, or even half a pain pill at a time.  You can also use Benadryl  over the counter for itching or also to help with sleep.   TED HOSE STOCKINGS:  Use stockings on both legs until for at least 2 weeks or as directed by physician office. They may be removed at night for sleeping.  MEDICATIONS:  See your medication summary on the After Visit Summary that nursing will review with you.  You may have some home medications which will be placed on hold until you complete the course of blood thinner medication.  It is important for you to complete the blood thinner medication as prescribed.  PRECAUTIONS:  If you experience chest pain or shortness  of breath - call 911 immediately for transfer to the hospital emergency department.   If you develop a fever greater that 101 F, purulent drainage from wound, increased redness or drainage from wound, foul odor from the wound/dressing, or calf pain - CONTACT YOUR SURGEON.                                                   FOLLOW-UP APPOINTMENTS:  If you do not already have a post-op appointment, please call the office for an appointment to be seen by your surgeon.  Guidelines for how soon to be seen are listed in your After Visit Summary, but are typically between 1-4 weeks after surgery.  OTHER INSTRUCTIONS:   Knee Replacement:  Do not place pillow under knee, focus on keeping the knee straight while resting. CPM instructions: 0-90 degrees, 2 hours in the morning, 2 hours in the afternoon, and 2 hours in the evening. Place foam block, curve side up under heel at all times except when in CPM or when walking.  DO NOT modify, tear, cut, or change the foam block in any way.  POST-OPERATIVE OPIOID TAPER INSTRUCTIONS: It is important to wean  off of your opioid medication as soon as possible. If you do not need pain medication after your surgery it is ok to stop day one. Opioids include: Codeine, Hydrocodone (Norco, Vicodin), Oxycodone (Percocet, oxycontin ) and hydromorphone  amongst others.  Long term and even short term use of opiods can cause: Increased pain response Dependence Constipation Depression Respiratory depression And more.  Withdrawal symptoms can include Flu like symptoms Nausea, vomiting And more Techniques to manage these symptoms Hydrate well Eat regular healthy meals Stay active Use relaxation techniques(deep breathing, meditating, yoga) Do Not substitute Alcohol to help with tapering If you have been on opioids for less than two weeks and do not have pain than it is ok to stop all together.  Plan to wean off of opioids This plan should start within one week post op of your joint replacement. Maintain the same interval or time between taking each dose and first decrease the dose.  Cut the total daily intake of opioids by one tablet each day Next start to increase the time between doses. The last dose that should be eliminated is the evening dose.     MAKE SURE YOU:  Understand these instructions.  Get help right away if you are not doing well or get worse.    Thank you for letting us  be a part of your medical care team.  It is a privilege we respect greatly.  We hope these instructions will help you stay on track for a fast and full recovery!   Increase activity slowly as tolerated   Complete by: As directed    Increase activity slowly as tolerated   Complete by: As directed    Post-operative opioid taper instructions:   Complete by: As directed    POST-OPERATIVE OPIOID TAPER INSTRUCTIONS: It is important to wean off of your opioid medication as soon as possible. If you do not need pain medication after your surgery it is ok to stop day one. Opioids include: Codeine, Hydrocodone (Norco,  Vicodin), Oxycodone (Percocet, oxycontin ) and hydromorphone  amongst others.  Long term and even short term use of opiods can cause: Increased pain response Dependence Constipation Depression Respiratory depression And more.  Withdrawal symptoms can  include Flu like symptoms Nausea, vomiting And more Techniques to manage these symptoms Hydrate well Eat regular healthy meals Stay active Use relaxation techniques(deep breathing, meditating, yoga) Do Not substitute Alcohol to help with tapering If you have been on opioids for less than two weeks and do not have pain than it is ok to stop all together.  Plan to wean off of opioids This plan should start within one week post op of your joint replacement. Maintain the same interval or time between taking each dose and first decrease the dose.  Cut the total daily intake of opioids by one tablet each day Next start to increase the time between doses. The last dose that should be eliminated is the evening dose.      Post-operative opioid taper instructions:   Complete by: As directed    POST-OPERATIVE OPIOID TAPER INSTRUCTIONS: It is important to wean off of your opioid medication as soon as possible. If you do not need pain medication after your surgery it is ok to stop day one. Opioids include: Codeine, Hydrocodone (Norco, Vicodin), Oxycodone (Percocet, oxycontin ) and hydromorphone  amongst others.  Long term and even short term use of opiods can cause: Increased pain response Dependence Constipation Depression Respiratory depression And more.  Withdrawal symptoms can include Flu like symptoms Nausea, vomiting And more Techniques to manage these symptoms Hydrate well Eat regular healthy meals Stay active Use relaxation techniques(deep breathing, meditating, yoga) Do Not substitute Alcohol to help with tapering If you have been on opioids for less than two weeks and do not have pain than it is ok to stop all together.  Plan to  wean off of opioids This plan should start within one week post op of your joint replacement. Maintain the same interval or time between taking each dose and first decrease the dose.  Cut the total daily intake of opioids by one tablet each day Next start to increase the time between doses. The last dose that should be eliminated is the evening dose.          DISCHARGE MEDICATIONS:   Allergies as of 11/19/2023       Reactions   Azithromycin  Nausea And Vomiting   Codeine Nausea And Vomiting   Penicillins Other (See Comments)   didn't feel like myself Has patient had a PCN reaction causing immediate rash, facial/tongue/throat swelling, SOB or lightheadedness with hypotension: Yes Has patient had a PCN reaction causing severe rash involving mucus membranes or skin necrosis: No Has patient had a PCN reaction that required hospitalization No Has patient had a PCN reaction occurring within the last 10 years: No If all of the above answers are NO, then may proceed with Cephalosporin use.   Prednisone Nausea And Vomiting   Raises pressure in her eyes        Medication List     STOP taking these medications    lisinopril  10 MG tablet Commonly known as: ZESTRIL    meclizine  25 MG tablet Commonly known as: ANTIVERT    pantoprazole  40 MG tablet Commonly known as: PROTONIX    simvastatin  40 MG tablet Commonly known as: ZOCOR        TAKE these medications    acetaminophen  500 MG tablet Commonly known as: TYLENOL  Take 1,000 mg by mouth every 6 (six) hours as needed for mild pain or moderate pain.   amLODipine  2.5 MG tablet Commonly known as: NORVASC  Take 2.5 mg by mouth daily.   aspirin  EC 81 MG tablet Take 1 tablet (81 mg total) by mouth  2 (two) times daily. Swallow whole.   atorvastatin  40 MG tablet Commonly known as: LIPITOR Take 40 mg by mouth every Friday.   COLLAGEN PO Take 1 Scoop by mouth daily.   ferrous sulfate  325 (65 FE) MG EC tablet Take 325 mg by  mouth daily.   latanoprost  0.005 % ophthalmic solution Commonly known as: XALATAN  Place 1 drop into both eyes at bedtime.   lisinopril -hydrochlorothiazide  20-12.5 MG tablet Commonly known as: ZESTORETIC  Take 1 tablet by mouth daily.   methocarbamol  500 MG tablet Commonly known as: ROBAXIN  Take 1-2 tablets (500-1,000 mg total) by mouth 4 (four) times daily.   ondansetron  4 MG disintegrating tablet Commonly known as: ZOFRAN -ODT Take 1 tablet (4 mg total) by mouth every 8 (eight) hours as needed for nausea or vomiting.   oxyCODONE  5 MG immediate release tablet Commonly known as: Oxy IR/ROXICODONE  Take 1-2 tablets (5-10 mg total) by mouth every 6 (six) hours as needed.   timolol  0.5 % ophthalmic solution Commonly known as: TIMOPTIC  Place 1 drop into both eyes 2 (two) times daily.   Womens Multivitamin Tabs Take 1 tablet by mouth daily.               Durable Medical Equipment  (From admission, onward)           Start     Ordered   11/18/23 1549  DME Walker rolling  Once       Question:  Patient needs a walker to treat with the following condition  Answer:  S/P total knee replacement   11/18/23 1548   11/18/23 1549  DME 3 n 1  Once        11/18/23 1548   11/18/23 1549  DME Bedside commode  Once       Question:  Patient needs a bedside commode to treat with the following condition  Answer:  S/P total knee replacement   11/18/23 1548            FOLLOW UP VISIT:    DISPOSITION: HOME VS. SNF  Dental Antibiotics:  In most cases prophylactic antibiotics for Dental procdeures after total joint surgery are not necessary.  Exceptions are as follows:  1. History of prior total joint infection  2. Severely immunocompromised (Organ Transplant, cancer chemotherapy, Rheumatoid biologic meds such as Humera)  3. Poorly controlled diabetes (A1C &gt; 8.0, blood glucose over 200)  If you have one of these conditions, contact your surgeon for an antibiotic  prescription, prior to your dental procedure.   CONDITION:  Good   [name] 11/19/2023, 12:30 PM

## 2023-11-19 NOTE — Anesthesia Postprocedure Evaluation (Signed)
 Anesthesia Post Note  Patient: Magdalynn Davilla  Procedure(s) Performed: ARTHROPLASTY, KNEE, TOTAL (Left: Knee)     Patient location during evaluation: PACU Anesthesia Type: General Level of consciousness: awake and alert Pain management: pain level controlled Vital Signs Assessment: post-procedure vital signs reviewed and stable Respiratory status: spontaneous breathing, nonlabored ventilation, respiratory function stable and patient connected to nasal cannula oxygen Cardiovascular status: blood pressure returned to baseline and stable Postop Assessment: no apparent nausea or vomiting Anesthetic complications: no   No notable events documented.  Last Vitals:  Vitals:   11/19/23 0643 11/19/23 0903  BP: 132/70 (!) 101/51  Pulse: 68 (!) 57  Resp: 17 14  Temp: 36.8 C 37.2 C  SpO2: 97% 96%    Last Pain:  Vitals:   11/19/23 1245  TempSrc:   PainSc: 5                  Nilam Quakenbush

## 2023-11-19 NOTE — Progress Notes (Signed)
 SPORTS MEDICINE AND JOINT REPLACEMENT  Garnette Raman, MD    Laurell Simpers, PA-C [address] [place], [place], [place]  [address]                             [phone]   PROGRESS NOTE  Subjective:  negative for Chest Pain  negative for Shortness of Breath  negative for Nausea/Vomiting   negative for Calf Pain  negative for Bowel Movement   Tolerating Diet: yes         Patient reports pain as 4 on 0-10 scale.    Objective: Vital signs in last 24 hours:   Patient Vitals for the past 24 hrs:  BP Temp Temp src Pulse Resp SpO2  11/19/23 0903 (!) 101/51 98.9 F (37.2 C) Oral (!) 57 14 96 %  11/19/23 0643 132/70 98.3 F (36.8 C) Oral 68 17 97 %  11/19/23 0120 130/79 98.5 F (36.9 C) Oral 71 18 94 %  11/18/23 2155 (!) 150/83 97.9 F (36.6 C) -- 71 15 96 %  11/18/23 1742 -- -- -- 62 -- --  11/18/23 1736 131/79 98.1 F (36.7 C) -- (!) 52 15 100 %  11/18/23 1653 (!) 171/82 -- -- 61 -- 98 %  11/18/23 1543 (!) 145/73 98 F (36.7 C) Oral 62 12 99 %  11/18/23 1530 129/64 97.8 F (36.6 C) -- (!) 51 12 98 %  11/18/23 1515 122/68 -- -- (!) 50 19 97 %  11/18/23 1500 121/66 -- -- (!) 48 12 97 %  11/18/23 1445 122/66 -- -- (!) 48 12 97 %  11/18/23 1430 123/72 -- -- 61 (!) 23 96 %  11/18/23 1415 129/63 97.6 F (36.4 C) -- (!) 48 14 97 %  11/18/23 1400 135/62 -- -- (!) 46 19 98 %  11/18/23 1345 137/83 (!) 97 F (36.1 C) -- (!) 53 (!) 21 98 %  11/18/23 1330 124/69 -- -- (!) 54 17 100 %  11/18/23 1315 120/62 -- -- (!) 53 10 99 %  11/18/23 1300 111/73 -- -- (!) 50 18 93 %  11/18/23 1245 110/70 -- -- (!) 49 12 100 %  11/18/23 1233 117/70 97.6 F (36.4 C) -- (!) 53 12 100 %    @flow {1959:LAST@   Intake/Output from previous day:   08/04 0701 - 08/05 0700 In: 1325.4 [P.O.:150; I.V.:775.4] Out: 2800 [Urine:2725]   Intake/Output this shift:   08/05 0701 - 08/05 1900 In: 240 [P.O.:240] Out: -    Intake/Output      08/04 0701 08/05 0700 08/05 0701 08/06 0700   P.O. 150 240    I.V. (mL/kg) 775.4 (11.6)    IV Piggyback 400    Total Intake(mL/kg) 1325.4 (19.9) 240 (3.6)   Urine (mL/kg/hr) 2725    Emesis/NG output 50    Blood 25    Total Output 2800    Net -1474.6 +240           LABORATORY DATA: No results for input(s): WBC, HGB, HCT, PLT in the last 168 hours. No results for input(s): NA, K, CL, CO2, BUN, CREATININE, GLUCOSE, CALCIUM  in the last 168 hours. No results found for: INR, PROTIME  Examination:  General appearance: alert, cooperative, and no distress Extremities: extremities normal, atraumatic, no cyanosis or edema  Wound Exam: clean, dry, intact   Drainage:  None: wound tissue dry  Motor Exam: Quadriceps and Hamstrings Intact  Sensory Exam: Superficial Peroneal, Deep Peroneal,  and Tibial normal   Assessment:    1 Day Post-Op  Procedure(s) (LRB): ARTHROPLASTY, KNEE, TOTAL (Left)  ADDITIONAL DIAGNOSIS:  Principal Problem:   S/P revision of total knee, left     Plan: Physical Therapy as ordered Weight Bearing as Tolerated (WBAT)  DVT Prophylaxis:  Aspirin   DISCHARGE PLAN: Home  Patient doing well, pain under control, passed PT. Ready for D/C home       Patient's anticipated LOS is less than 2 midnights, meeting these requirements: - Lives within 1 hour of care - Has a competent adult at home to recover with post-op recover - NO history of  - Chronic pain requiring opiods  - Diabetes  - Coronary Artery Disease  - Heart failure  - Heart attack  - Stroke  - DVT/VTE  - Cardiac arrhythmia  - Respiratory Failure/COPD  - Renal failure  - Anemia  - Advanced Liver disease      [name] 11/19/2023, 12:28 PM

## 2023-11-19 NOTE — Plan of Care (Signed)

## 2023-11-19 NOTE — Progress Notes (Signed)
 Physical Therapy Treatment Patient Details Name: Kristy Cox MRN: [id number] DOB: [birth year] Today's Date: 11/19/2023   History of Present Illness Pt is 78 yo female s/p L TKA on 11/18/23.  Pt with hx including but not limited to anemia, back pain, dizziness, HTN, HLD, OP, arthritis, R TKA    PT Comments  Pt is POD # 1 and is progressing well.  Pt much more alert and feeling better today.  Reports pain as tolerable.  She has excellent ROM and quad activation.  Pt with good understanding of HEP, safety, and rehab process from prior TKA.  She was able to ambulate 120' with RW and supervision and performed 10 steps with bil rails.  Pt has support and DME with outpt PT scheduled.  Pt demonstrates safe gait & transfers in order to return home from PT perspective once discharged by MD.  While in hospital, will continue to benefit from PT for skilled therapy to advance mobility and exercises.      If plan is discharge home, recommend the following: A little help with walking and/or transfers;A little help with bathing/dressing/bathroom;Assistance with cooking/housework;Help with stairs or ramp for entrance   Can travel by private vehicle        Equipment Recommendations  None recommended by PT    Recommendations for Other Services       Precautions / Restrictions Precautions Precautions: Fall;Knee Restrictions LLE Weight Bearing Per Provider Order: Weight bearing as tolerated     Mobility  Bed Mobility Overal bed mobility: Needs Assistance Bed Mobility: Supine to Sit     Supine to sit: Supervision          Transfers Overall transfer level: Needs assistance Equipment used: Rolling walker (2 wheels) Transfers: Sit to/from Stand Sit to Stand: Supervision           General transfer comment: STS from bed x 2, chair x 1, and toilet x1; initial cues for hand placement with good carryover    Ambulation/Gait Ambulation/Gait assistance: Contact guard assist Gait Distance  (Feet): 120 Feet Assistive device: Rolling walker (2 wheels) Gait Pattern/deviations: Step-to pattern, Decreased stride length, Decreased weight shift to left, Antalgic Gait velocity: decreased     General Gait Details: mild antalgic pattern but overall steady gait pattern at functional speed   Stairs Stairs: Yes Stairs assistance: Contact guard assist Stair Management: Two rails, Step to pattern, Forwards Number of Stairs: 10 General stair comments: tolerated well; cues for sequencing with good carryover   Wheelchair Mobility     Tilt Bed    Modified Rankin (Stroke Patients Only)       Balance Overall balance assessment: Needs assistance Sitting-balance support: No upper extremity supported Sitting balance-Leahy Scale: Good     Standing balance support: Bilateral upper extremity supported, No upper extremity supported Standing balance-Leahy Scale: Fair Standing balance comment: RW to ambulate- steady; could stand for ADLs without UE support                            Communication    Cognition Arousal: Alert Behavior During Therapy: WFL for tasks assessed/performed   PT - Cognitive impairments: No apparent impairments                       PT - Cognition Comments: Pt feeling much better and alert today        Cueing    Exercises Total Joint Exercises Ankle Circles/Pumps: AROM, Both, 10  reps, Supine Quad Sets: AROM, Both, 10 reps, Supine Heel Slides: AAROM, Left, 10 reps, Supine Hip ABduction/ADduction: AAROM, Left, 10 reps, Supine Long Arc Quad: AAROM, Left, 10 reps, Seated Knee Flexion: AAROM, Left, 10 reps, Seated Goniometric ROM: L knee 5 to 90    General Comments   Educated on safe ice use, no pivots, car transfers, resting with leg straight, and TED hose during day. Also, encouraged walking every 1-2 hours during day. Educated on HEP with focus on mobility the first weeks. Discussed doing exercises within pain control and if  pain increasing could decreased ROM, reps, and stop exercises as needed. Encouraged to perform quad sets and ankle pumps frequently for blood flow and to promote full knee extension.      Pertinent Vitals/Pain Pain Assessment Pain Assessment: Faces Pain Score: 5  Pain Location: L knee Pain Descriptors / Indicators: Discomfort Pain Intervention(s): Limited activity within patient's tolerance, Monitored during session, Premedicated before session, Repositioned    Home Living                          Prior Function            PT Goals (current goals can now be found in the care plan section) Progress towards PT goals: Progressing toward goals    Frequency    7X/week      PT Plan      Co-evaluation              AM-PAC PT 6 Clicks Mobility   Outcome Measure  Help needed turning from your back to your side while in a flat bed without using bedrails?: A Little Help needed moving from lying on your back to sitting on the side of a flat bed without using bedrails?: A Little Help needed moving to and from a bed to a chair (including a wheelchair)?: A Little Help needed standing up from a chair using your arms (e.g., wheelchair or bedside chair)?: A Little Help needed to walk in hospital room?: A Little Help needed climbing 3-5 steps with a railing? : A Little 6 Click Score: 18    End of Session Equipment Utilized During Treatment: Gait belt Activity Tolerance: Patient tolerated treatment well Patient left: with call bell/phone within reach;in chair Nurse Communication: Mobility status PT Visit Diagnosis: Other abnormalities of gait and mobility (R26.89);Muscle weakness (generalized) (M62.81)     Time: 8853-8774 PT Time Calculation (min) (ACUTE ONLY): 39 min  Charges:    $Gait Training: 8-22 mins $Therapeutic Exercise: 8-22 mins $Therapeutic Activity: 8-22 mins PT General Charges $$ ACUTE PT VISIT: 1 Visit                     Benjiman, PT Acute  Rehab [facility] Rehab [phone]    Benjiman VEAR Mulberry 11/19/2023, 12:38 PM

## 2023-11-19 NOTE — Progress Notes (Signed)
 Discharge instructions given to patient questions asked and answered. Sylvia Everts RN

## 2023-11-19 NOTE — TOC Transition Note (Signed)
 Transition of Care [facility]) - Discharge Note   Patient Details  Name: Kristy Cox MRN: [id number] Date of Birth: [birth year]  Transition of Care [facility] CM/SW Contact:  NORMAN ASPEN, LCSW Phone Number: 11/19/2023, 11:46 AM   Clinical Narrative:     Met with pt and son who confirm pt has needed DME in the home.  OPPT already arranged with Dr. Dwan office.  No further TOC needs.  Final next level of care: OP Rehab Barriers to Discharge: No Barriers Identified   Patient Goals and CMS Choice Patient states their goals for this hospitalization and ongoing recovery are:: return home          Discharge Placement                       Discharge Plan and Services Additional resources added to the After Visit Summary for                  DME Arranged: N/A DME Agency: NA                  Social Drivers of Health (SDOH) Interventions SDOH Screenings   Food Insecurity: No Food Insecurity (11/18/2023)  Housing: Low Risk  (11/18/2023)  Transportation Needs: No Transportation Needs (11/18/2023)  Utilities: Not At Risk (11/18/2023)  Financial Resource Strain: Unknown (05/10/2020)   Received from [facility] visits prior to 06/16/2022.  Physical Activity: Unknown (05/10/2020)   Received from [facility] visits prior to 06/16/2022.  Social Connections: Moderately Isolated (11/18/2023)  Stress: No Stress Concern Present (05/10/2020)   Received from [facility] visits prior to 06/16/2022.  Tobacco Use: Low Risk  (11/18/2023)     Readmission Risk Interventions     No data to display
